# Patient Record
Sex: Female | Born: 1950 | Race: White | Hispanic: No | Marital: Married | Smoking: Never smoker
Health system: Southern US, Community
[De-identification: ages and names within clinical notes are randomized; demographics above are authoritative.]

## PROBLEM LIST (undated history)

## (undated) DIAGNOSIS — K219 Gastro-esophageal reflux disease without esophagitis: Secondary | ICD-10-CM

## (undated) DIAGNOSIS — T7840XA Allergy, unspecified, initial encounter: Secondary | ICD-10-CM

## (undated) DIAGNOSIS — K589 Irritable bowel syndrome without diarrhea: Secondary | ICD-10-CM

## (undated) DIAGNOSIS — M810 Age-related osteoporosis without current pathological fracture: Secondary | ICD-10-CM

## (undated) HISTORY — DX: Allergy, unspecified, initial encounter: T78.40XA

## (undated) HISTORY — DX: Irritable bowel syndrome, unspecified: K58.9

## (undated) HISTORY — DX: Age-related osteoporosis without current pathological fracture: M81.0

## (undated) HISTORY — DX: Gastro-esophageal reflux disease without esophagitis: K21.9

## (undated) HISTORY — PX: FLEXOR TENDON OF FOREARM / WRIST REPAIR: SHX1650

---

## 1986-07-25 HISTORY — PX: TUBAL LIGATION: SHX77

## 1996-07-25 HISTORY — PX: OTHER SURGICAL HISTORY: SHX169

## 1999-03-16 ENCOUNTER — Other Ambulatory Visit: Admission: RE | Admit: 1999-03-16 | Discharge: 1999-03-16 | Payer: Self-pay | Admitting: Obstetrics and Gynecology

## 1999-04-13 ENCOUNTER — Ambulatory Visit (HOSPITAL_BASED_OUTPATIENT_CLINIC_OR_DEPARTMENT_OTHER): Admission: RE | Admit: 1999-04-13 | Discharge: 1999-04-13 | Payer: Self-pay | Admitting: General Practice

## 2000-05-15 ENCOUNTER — Other Ambulatory Visit: Admission: RE | Admit: 2000-05-15 | Discharge: 2000-05-15 | Payer: Self-pay | Admitting: Obstetrics and Gynecology

## 2001-05-21 ENCOUNTER — Other Ambulatory Visit: Admission: RE | Admit: 2001-05-21 | Discharge: 2001-05-21 | Payer: Self-pay | Admitting: Obstetrics and Gynecology

## 2002-07-23 ENCOUNTER — Ambulatory Visit (HOSPITAL_COMMUNITY): Admission: RE | Admit: 2002-07-23 | Discharge: 2002-07-23 | Payer: Self-pay | Admitting: Orthopedic Surgery

## 2002-07-23 ENCOUNTER — Encounter: Payer: Self-pay | Admitting: Orthopedic Surgery

## 2004-09-08 ENCOUNTER — Ambulatory Visit: Payer: Self-pay | Admitting: Internal Medicine

## 2004-10-15 ENCOUNTER — Ambulatory Visit: Payer: Self-pay | Admitting: Internal Medicine

## 2005-02-07 ENCOUNTER — Encounter: Admission: RE | Admit: 2005-02-07 | Discharge: 2005-02-07 | Payer: Self-pay | Admitting: Neurology

## 2005-07-25 HISTORY — PX: FACIAL COSMETIC SURGERY: SHX629

## 2006-02-22 ENCOUNTER — Emergency Department (HOSPITAL_COMMUNITY): Admission: EM | Admit: 2006-02-22 | Discharge: 2006-02-23 | Payer: Self-pay | Admitting: Emergency Medicine

## 2006-03-14 ENCOUNTER — Ambulatory Visit (HOSPITAL_COMMUNITY): Admission: RE | Admit: 2006-03-14 | Discharge: 2006-03-15 | Payer: Self-pay | Admitting: Surgery

## 2006-03-14 ENCOUNTER — Encounter (INDEPENDENT_AMBULATORY_CARE_PROVIDER_SITE_OTHER): Payer: Self-pay | Admitting: Specialist

## 2006-03-25 HISTORY — PX: CHOLECYSTECTOMY: SHX55

## 2007-04-25 HISTORY — PX: ANTERIOR FUSION CERVICAL SPINE: SUR626

## 2007-05-15 ENCOUNTER — Observation Stay (HOSPITAL_COMMUNITY): Admission: RE | Admit: 2007-05-15 | Discharge: 2007-05-16 | Payer: Self-pay | Admitting: Neurosurgery

## 2007-09-26 ENCOUNTER — Encounter: Payer: Self-pay | Admitting: Internal Medicine

## 2008-10-02 ENCOUNTER — Ambulatory Visit: Payer: Self-pay | Admitting: Genetic Counselor

## 2010-12-07 NOTE — Op Note (Signed)
NAMECORTINA, VULTAGGIO              ACCOUNT NO.:  [id number]   MEDICAL RECORD NO.:  [id number]          PATIENT TYPE:  INP   LOCATION:  2899                         FACILITY:  [facility]   PHYSICIAN:  Danae Orleans. Venetia Maxon, M.D.  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   PROCEDURE:  Anterior cervical decompression and fusion C5-6 and C6-7  with allograft bone spacers along with morselized bone autograft and  anterior cervical plate.   SURGEON:  Danae Orleans. Venetia Maxon, M.D.   ASSISTANT:  Georgiann Cocker, RN and Lovell Sheehan   ANESTHESIA:  General endotracheal anesthesia.   ESTIMATED BLOOD LOSS:  Minimal.   COMPLICATIONS:  None.   DISPOSITION:  Recovery.   INDICATIONS:  Kiara Mcdowell is a 60 year old woman with left arm pain  with C5-6 and C6-7 spondylitic disk herniations.  It was elected take  her to surgery for anterior cervical decompression and fusion after she  did not improve with conservative measures.   PROCEDURE:  Ms. Hoefling is brought to the operating room.  Following  satisfactory and uncomplicated induction of general endotracheal  anesthesia and placement of intravenous lines, the patient was placed in  supine position on the operating table.  Her neck was placed in slight  extension and she was placed in 5 pounds halter traction on a horseshoe  head holder.  Her anterior neck was then prepped and draped in the usual  sterile fashion.  Area of planned incision was infiltrated with 0.25%  Marcaine and 0.5% lidocaine with 1:200,000 epinephrine.  Incision was  made from midline to the anterior border of the sternocleidomastoid  muscle  on the left side of midline.  The subplatysmal dissection was  performed exposing the anterior border sternocleidomastoid muscle.  Using blunt dissection the carotid sheath was kept lateral, trachea and  esophagus kept medial, exposing the anterior cervical spine.  A bent  spinal needle was placed at what was felt to be the C5-6 level.  This  was confirmed on intraoperative x-ray.  Subsequently longus colli  muscles were taken down from the anterior cervical spine from C5 to C7  using electrocautery and Key elevator and self-retaining retractor was  placed to facilitate exposure.  Ventral osteophytes were removed with  Leksell rongeur and bone was saved for later use with bone grafting.  The interspaces at C5-6 and C6-7 were then incised with a 15 blade and  disk material was removed in piecemeal fashion.  Endplates were stripped  of residual disk material.  Distraction pins were placed initially at C6  and C7 and a thorough diskectomy was performed.  The endplates were  decorticated and uncinate spurs drilled down.  The posterior  longitudinal ligament was incised and removed in piecemeal fashion  resulting in decompression of spinal cord dura and both C7 nerve roots.  After trial sizing, a 7 mm allograft wedge was then fashioned with high-  speed drill, packed with morcellized bone autograft and some  demineralized bone matrix inserted the interspace and countersunk  appropriately.  Attention was then turned to the C5-6 level where  similar decompression was performed.  Distraction pins were placed.  Endplates were decorticated with a high-speed drill.  The posterior  longitudinal ligament and uncinate spurs were removed with 2 and 3 mm  gold tip Kerrison rongeurs and both C6 nerve roots were well  decompressed at they extended out the neural foramina.  Hemostasis was  again assured with Gelfoam soaked in thrombin and after trial sizing an  8-mm bone allograft wedge was selected thinned  to approximately 6 mm in  height, packed with morselized bone autograft and the DBM and inserted  in the interspace, countersunk appropriately.  A 32 mm anterior cervical  plate was then affixed to the anterior cervical spine using variable  angle 14 mm screws.  Traction was removed prior to placing the plate.  All screws had excellent purchase and locking mechanisms were engaged.  Final x-ray demonstrated well-positioned interbody graft and anterior  cervical plate.  Soft tissues were inspected and found to be in good  repair.  The platysma layer was closed with 3-0 Vicryl sutures.  The  skin edges were approximated 3-0 Vicryl interrupted inverted sutures.  Wound was dressed with Dermabond.  The patient extubated in the  operating room, taken recovery in stable satisfactory condition having  tolerated operation well.  All counts correct at end the case.      Danae Orleans. Venetia Maxon, M.D.  Electronically Signed     JDS/MEDQ  D:  05/15/2007  T:  05/16/2007  Job:  [id number]

## 2011-01-10 ENCOUNTER — Telehealth: Payer: Self-pay | Admitting: Internal Medicine

## 2011-01-10 NOTE — Telephone Encounter (Signed)
Patient state she has had diarrhea and cramping since last Thursday. Has diarrhea 4 times/day. Anytime she eats or drinks she has diarrhea. Denies bleeding, vomiting or fever. Has not taken anything for diarrhea. She will try Imodium. States in the past, Dr. Juanda Chance gave her something for spasms and would like it again. Last EGD, colon 2006- IBS. Please, advise.

## 2011-01-10 NOTE — Telephone Encounter (Signed)
Please call in Bentyl 10 mg, #30, 1 po tid, ac, also try Cipro 250 mg po bid,x 5 days, If no better, will need to see an extender , I will be out of town next week.

## 2011-01-12 MED ORDER — CIPROFLOXACIN HCL 250 MG PO TABS: ORAL_TABLET | ORAL | Status: DC

## 2011-01-12 MED ORDER — DICYCLOMINE HCL 10 MG PO CAPS: ORAL_CAPSULE | ORAL | Status: DC

## 2011-01-12 NOTE — Telephone Encounter (Signed)
Spoke with patient and gave her Dr. Regino Schultze recommendations and told her the rx have been sent.

## 2011-01-12 NOTE — Telephone Encounter (Signed)
Rx sent to pharmacy. Left a message for patient to call me back.

## 2011-03-01 ENCOUNTER — Observation Stay (HOSPITAL_COMMUNITY)
Admission: EM | Admit: 2011-03-01 | Discharge: 2011-03-02 | DRG: 143 | Disposition: A | Discharge status: Home / Self Care | Payer: BC Managed Care – PPO | Source: Ambulatory Visit | Attending: Cardiology | Admitting: Cardiology

## 2011-03-01 ENCOUNTER — Emergency Department (HOSPITAL_COMMUNITY): Payer: BC Managed Care – PPO

## 2011-03-01 DIAGNOSIS — R209 Unspecified disturbances of skin sensation: Secondary | ICD-10-CM | POA: Insufficient documentation

## 2011-03-01 DIAGNOSIS — R0602 Shortness of breath: Secondary | ICD-10-CM | POA: Insufficient documentation

## 2011-03-01 DIAGNOSIS — R079 Chest pain, unspecified: Principal | ICD-10-CM | POA: Insufficient documentation

## 2011-03-01 LAB — COMPREHENSIVE METABOLIC PANEL
ALT: 15 U/L (ref 0–35)
Alkaline Phosphatase: 66 U/L (ref 39–117)
CO2: 25 mEq/L (ref 19–32)
Calcium: 9.7 mg/dL (ref 8.4–10.5)
GFR calc Af Amer: >60 mL/min (ref >60)
GFR calc non Af Amer: >60 mL/min (ref >60)
Glucose, Bld: 91 mg/dL (ref 70–99)
Potassium: 3.7 mEq/L (ref 3.5–5.1)
Sodium: 137 mEq/L (ref 135–145)

## 2011-03-01 LAB — CBC
HCT: 38.3 % (ref 36.0–46.0)
MCHC: 35.8 g/dL (ref 30.0–36.0)
Platelets: 245 10*3/uL (ref 150–400)
RDW: 12.7 % (ref 11.5–15.5)

## 2011-03-01 LAB — DIFFERENTIAL
Basophils Absolute: 0.1 10*3/uL (ref 0.0–0.1)
Eosinophils Absolute: 0.2 10*3/uL (ref 0.0–0.7)
Eosinophils Relative: 2 % (ref 0–5)
Lymphocytes Relative: 38 % (ref 12–46)
Monocytes Absolute: 0.5 10*3/uL (ref 0.1–1.0)

## 2011-03-01 LAB — CK TOTAL AND CKMB (NOT AT ARMC): Total CK: 161 U/L (ref 7–177)

## 2011-03-02 ENCOUNTER — Other Ambulatory Visit (HOSPITAL_COMMUNITY): Payer: BC Managed Care – PPO

## 2011-03-02 ENCOUNTER — Inpatient Hospital Stay (HOSPITAL_COMMUNITY): Payer: BC Managed Care – PPO

## 2011-03-02 DIAGNOSIS — R079 Chest pain, unspecified: Secondary | ICD-10-CM

## 2011-03-02 LAB — LIPID PANEL
Cholesterol: 233 mg/dL — ABNORMAL HIGH (ref 0–200)
HDL: 88 mg/dL (ref >39)
LDL Cholesterol: 138 mg/dL — ABNORMAL HIGH (ref 0–99)
Total CHOL/HDL Ratio: 2.6 ratio
Triglycerides: 34 mg/dL (ref <150)
VLDL: 7 mg/dL (ref 0–40)

## 2011-03-02 LAB — CARDIAC PANEL(CRET KIN+CKTOT+MB+TROPI)
Relative Index: 1.9 (ref 0.0–2.5)
Relative Index: 2 (ref 0.0–2.5)
Troponin I: <0.3 ng/mL (ref <0.30)

## 2011-03-02 MED ORDER — TECHNETIUM TC 99M TETROFOSMIN IV KIT
30.0000 | PACK | Freq: Once | INTRAVENOUS | Status: AC | PRN
  Administered 2011-03-02: 30 via INTRAVENOUS

## 2011-03-02 MED ORDER — IOHEXOL 350 MG/ML SOLN
100.0000 mL | Freq: Once | INTRAVENOUS | Status: AC | PRN
  Administered 2011-03-02: 100 mL via INTRAVENOUS

## 2011-03-02 MED ORDER — TECHNETIUM TC 99M TETROFOSMIN IV KIT
10.0000 | PACK | Freq: Once | INTRAVENOUS | Status: AC | PRN
  Administered 2011-03-02: 10 via INTRAVENOUS

## 2011-03-03 NOTE — H&P (Signed)
  NAMEROSINE, SOLECKI NO.:  [id number]  MEDICAL RECORD NO.:  [id number]  LOCATION:  2035                         FACILITY:  [facility]  PHYSICIAN:  Armanda Magic, M.D.     DATE OF BIRTH:  1951/03/02  DATE OF ADMISSION:  03/01/2011 DATE OF DISCHARGE:                             HISTORY & PHYSICAL   REFERRING PHYSICIAN:  Hassan Buckler. Caporossi, MD  CHIEF COMPLAINT:  Chest tightness.  HISTORY OF PRESENT ILLNESS:  This is a 60 year old white female with no prior cardiac history who was in her usual state of health until 1 a.m. this morning when she awakened with severe left-sided chest pain and left arm numbness.  Her pain was initially increased with deep breathing and sharp and she was short of breath.  She says she got up and drank some water and walked around.  Initially, the pain was sharp and resolved within 20 seconds but then she would had chest pressure which she has had intermittently since then.  Currently, she complains of some vague chest pressure with intermittent left arm numbness.  She denies any nausea, vomiting, or diaphoresis.  PAST MEDICAL HISTORY:  None.  PAST SURGICAL HISTORY:  C-section x3, cholecystectomy, cervical spine fusion, facial surgery, tubal ligation, and pelvic bone cyst removal.  ALLERGIES:  None.  MEDICATIONS:  None.  SOCIAL HISTORY:  She is married with three children.  She denies any tobacco use.  She occasionally drinks alcohol socially.  FAMILY HISTORY:  Father died at 4 of hypertension and Alzheimer's.  Her mother is alive with permanent pacemaker 66.  She has two sisters and none with coronary artery disease.  REVIEW OF SYSTEMS:  Otherwise, stated in the HPI is negative.  PHYSICAL EXAMINATION:  GENERAL:  A well-developed, well-nourished female in no acute distress. HEENT:  Benign. NECK:  Supple without lymphadenopathy.  Carotid upstrokes are +2 bilaterally, no bruits. LUNGS:  Clear to auscultation  throughout. HEART:  Regular rate and rhythm.  No murmurs, rubs or gallops.  Normal S1, S2. ABDOMEN:  Soft, nontender, nondistended.  Normoactive bowel sounds.  No hepatosplenomegaly. EXTREMITIES:  No edema.  LABORATORY DATA:  Pending.  EKG shows normal sinus rhythm with no ST changes.  Chest x-ray is pending.  ASSESSMENT:  Chest pain with typical and atypical components.  EKG is nonischemic.  Cardiac enzymes are pending.  She has no cardiac risk factors except for age.  This could represent unstable angina versus gastroesophageal reflux with esophageal spasm.  PLAN:  Admit to telemetry bed.  We will cycle cardiac enzymes, IV heparin drip per Pharmacy.  Aspirin.  We will give a GI cocktail to see if pain is improved.  We will start IV nitroglycerin drip and titrate to keep pain-free.  We will check a D-dimer to rule out PE.  We will make n.p.o. after midnight if cardiac enzymes and D-dimer are normal.  We will plan stress Cardiolite study in the morning.     Armanda Magic, M.D.     TT/MEDQ  D:  03/01/2011  T:  03/02/2011  Job:  [id number]  cc:   [facility]  Electronically Signed by Armanda Magic M.D. on 03/03/2011 07:39:05 PM

## 2011-03-04 ENCOUNTER — Telehealth: Payer: Self-pay | Admitting: Internal Medicine

## 2011-03-04 NOTE — Telephone Encounter (Signed)
Scheduled patient on 03/14/11 at 2:00 PM. Called and left a message for patient to call me.

## 2011-03-04 NOTE — Telephone Encounter (Signed)
Patient given appointment time.

## 2011-03-10 ENCOUNTER — Encounter: Payer: Self-pay | Admitting: Internal Medicine

## 2011-03-14 ENCOUNTER — Other Ambulatory Visit (INDEPENDENT_AMBULATORY_CARE_PROVIDER_SITE_OTHER): Payer: BC Managed Care – PPO

## 2011-03-14 ENCOUNTER — Ambulatory Visit (INDEPENDENT_AMBULATORY_CARE_PROVIDER_SITE_OTHER): Payer: BC Managed Care – PPO | Admitting: Internal Medicine

## 2011-03-14 ENCOUNTER — Encounter: Payer: Self-pay | Admitting: Internal Medicine

## 2011-03-14 DIAGNOSIS — R079 Chest pain, unspecified: Secondary | ICD-10-CM

## 2011-03-14 DIAGNOSIS — R918 Other nonspecific abnormal finding of lung field: Secondary | ICD-10-CM

## 2011-03-14 DIAGNOSIS — R9389 Abnormal findings on diagnostic imaging of other specified body structures: Secondary | ICD-10-CM

## 2011-03-14 LAB — AMYLASE: Amylase: 90 U/L (ref 27–131)

## 2011-03-14 LAB — LIPASE: Lipase: 42 U/L (ref 11.0–59.0)

## 2011-03-14 LAB — HEPATIC FUNCTION PANEL
ALT: 16 U/L (ref 0–35)
Albumin: 4.3 g/dL (ref 3.5–5.2)
Total Bilirubin: 0.6 mg/dL (ref 0.3–1.2)

## 2011-03-14 NOTE — Patient Instructions (Addendum)
You have been scheduled for an abdominal ultrasound at [facility] (1st floor of hospital) on 03/15/11 at 3:00 pm. Please arrive 15 minutes prior to your appointment for registration. Make certain not to have anything to eat or drink 6 hours prior to your appointment. Should you need to reschedule your appointment, please contact radiology at [phone]. You have been scheduled to see Dr Shelle Iron @ [facility] (2nd floor, [address]) on 03/30/11 @ 3:30 pm with a 3:15 pm arrival. Please bring insurance cards, all medications (including supplements) and any copay you may have. Your physician has requested that you go to the basement for the following lab work before leaving today: Hepatic Fx, Amylase, Lipase

## 2011-03-14 NOTE — Progress Notes (Signed)
[name] 05-May-1951 MRN [id number]     History of Present Illness:  This is a 60 year old white female with an episode of severe substernal chest pain and precordial pain evaluated in the emergency room 2 weeks ago. Cardiac causes have been ruled out. She saw Dr Golden Circle. She has had belching and dyspepsia but no dysphagia. She has a history of gastroesophageal reflux evaluated with an upper endoscopy in 2006. She subsequently underwent a laparoscopic cholecystectomy in 2008 for choledocholithiasis. She has been on Protonix 40 mg a day for 2 weeks and the pain has resolved but she has an ongoing heaviness substernally and in the left precordium. The pain is not pleuritic or associated with exertion. Initially, the pain was worse when lying down. She denies history of asthma. She has occassional cough. She has never smoked. A chest x-ray in 2001 showed hyperinflation. Her most recent chest x-ray shows some mild cardiomegaly with mild COPD. Patient denies shortness of breath.   Past Medical History  Diagnosis Date  . IBS (irritable bowel syndrome)   . Duodenitis 2006   Past Surgical History  Procedure Date  . Cesarean section     x 3  . Cholecystectomy   . Anterior fusion cervical spine   . Facial cosmetic surgery   . Tubal ligation   . Pelvic bone cyst resection     reports that she has never smoked. She does not have any smokeless tobacco history on file. She reports that she drinks alcohol. She reports that she does not use illicit drugs. family history includes Breast cancer in her maternal aunt and maternal grandmother; Crohn's disease in her sister; Heart disease in her mother; Hypertension in her father; Irritable bowel syndrome in her sister; and Stroke in her maternal grandmother.  There is no history of Colon cancer. No Known Allergies      Review of Systems: Negative for dysphagia, odynophagia. Negative for wheezing. Irregular bowel habits since cholecystectomy  The  remainder of the 10  point ROS is negative except as outlined in H&P   Physical Exam: General appearance  Well developed, in no distress. Eyes- non icteric. HEENT nontraumatic, normocephalic. Mouth no lesions, tongue papillated, no cheilosis. Neck supple without adenopathy, thyroid not enlarged, no carotid bruits, no JVD. Lungs Clear to auscultation bilaterally. Cor normal S1 normal S2, regular rhythm , no murmur,  quiet precordium. Abdomen soft, nontender with normoactive bowel sounds. No distention. No tenderness. Liver edge at costal margin. Rectal: Not done. Extremities no pedal edema. Skin no lesions. Neurological alert and oriented x 3. Psychological normal mood and affect.  Assessment and Plan:  Problem #1 Episodes of noncardiac chest pain in the setting of gastroesophageal reflux. It is only partially improved on PPI's. There was Early emphysema on chest x-ray. Needs further evaluation. Rule out  common bile ducts .Marland Kitchen We will obtain liver function test amylase and lipase an upper abdominal ultrasound. She is going out of the country in 2 days and we will wait until she comes back to proceed with upper endoscopy. We will also refer her for pulmonary consultation. She is to continue her Protonix 40 mg daily.  Problem #2 Colorectal screening. Her last colonoscopy was in March 2006. Her next colonoscopy will be due in March 2016.    03/14/2011 Lina Sar

## 2011-03-15 ENCOUNTER — Ambulatory Visit (HOSPITAL_COMMUNITY)
Admission: RE | Admit: 2011-03-15 | Discharge: 2011-03-15 | Disposition: A | Discharge status: Home / Self Care | Payer: BC Managed Care – PPO | Source: Ambulatory Visit | Attending: Internal Medicine | Admitting: Internal Medicine

## 2011-03-15 DIAGNOSIS — R1012 Left upper quadrant pain: Secondary | ICD-10-CM | POA: Insufficient documentation

## 2011-03-15 DIAGNOSIS — N289 Disorder of kidney and ureter, unspecified: Secondary | ICD-10-CM | POA: Insufficient documentation

## 2011-03-29 ENCOUNTER — Other Ambulatory Visit: Payer: Self-pay | Admitting: Obstetrics and Gynecology

## 2011-03-30 ENCOUNTER — Institutional Professional Consult (permissible substitution): Payer: BC Managed Care – PPO | Admitting: Pulmonary Disease

## 2011-04-01 ENCOUNTER — Ambulatory Visit (INDEPENDENT_AMBULATORY_CARE_PROVIDER_SITE_OTHER): Payer: BC Managed Care – PPO | Admitting: Pulmonary Disease

## 2011-04-01 ENCOUNTER — Encounter: Payer: Self-pay | Admitting: Pulmonary Disease

## 2011-04-01 VITALS — BP 100/66 | HR 68 | Temp 98.1°F | Ht 64.0 in | Wt 132.0 lb

## 2011-04-01 DIAGNOSIS — R918 Other nonspecific abnormal finding of lung field: Secondary | ICD-10-CM

## 2011-04-01 NOTE — Progress Notes (Signed)
  Subjective:    Patient ID: [name], female    DOB: 1951/01/23, 60 y.o.   MRN: [id number]  HPI The patient is a 60 year old female who I've been asked to see for an abnormal CT chest.  She recently had an episode of chest pain, and presented to the emergency room for evaluation.  She had a CT chest that was totally unremarkable except for 2 very small pulmonary nodules associated with the minor fissure.  The patient has never smoked, has no personal history of cancer, and tells me that her health maintenance is totally up to date.  She has never lived outside the state of [place], has no direct tuberculosis exposure history, and has had a negative PPD in the past.  She has no pulmonary symptoms of significance at this time.   Review of Systems  Constitutional: Negative for fever and unexpected weight change.  HENT: Negative for ear pain, nosebleeds, congestion, sore throat, rhinorrhea, sneezing, trouble swallowing, dental problem, postnasal drip and sinus pressure.   Eyes: Negative for redness and itching.  Respiratory: Positive for cough and shortness of breath. Negative for chest tightness and wheezing.   Cardiovascular: Positive for chest pain. Negative for palpitations and leg swelling.  Gastrointestinal: Negative for nausea and vomiting.  Genitourinary: Negative for dysuria.  Musculoskeletal: Positive for joint swelling.  Skin: Negative for rash.  Neurological: Negative for headaches.  Hematological: Does not bruise/bleed easily.  Psychiatric/Behavioral: Negative for dysphoric mood. The patient is not nervous/anxious.        Objective:   Physical Exam Constitutional:  Well developed, no acute distress  HENT:  Nares patent without discharge  Oropharynx without exudate, palate and uvula are normal  Eyes:  Perrla, eomi, no scleral icterus  Neck:  No JVD, no TMG  Cardiovascular:  Normal rate, regular rhythm, no rubs or gallops.  No murmurs        Intact distal  pulses  Pulmonary :  Normal breath sounds, no stridor or respiratory distress   No rales, rhonchi, or wheezing  Abdominal:  Soft, nondistended, bowel sounds present.  No tenderness noted.   Musculoskeletal:  No lower extremity edema noted.  Lymph Nodes:  No cervical lymphadenopathy noted  Skin:  No cyanosis noted  Neurologic:  Alert, appropriate, moves all 4 extremities without obvious deficit.         Assessment & Plan:

## 2011-04-01 NOTE — Patient Instructions (Signed)
Would recommend a followup ct chest in one year to followup your tiny nodules.  Please call us in July 2013 to schedule for August.

## 2011-04-01 NOTE — Assessment & Plan Note (Signed)
The patient has been noted to have 2 tiny nodules associated with the minor fissure on a recent CT chest.  She is in a very low risk group with no history of smoking or personal history of cancer.  Most likely these represent either an intrapulmonary lymph node, or possibly granulomas.  It is very unlikely to represent a malignant process.  At this point, I would recommend one CT followup at one year, and if no change would not pursue further.  The patient is to call me if something changes in her medical history over that time period.

## 2011-04-09 NOTE — Discharge Summary (Signed)
  [name], [name] NO.:  [id number]  MEDICAL RECORD NO.:  [id number]  LOCATION:  2035                         FACILITY:  [facility]  PHYSICIAN:  Marca Ancona, MD      DATE OF BIRTH:  1951-05-29  DATE OF ADMISSION:  03/01/2011 DATE OF DISCHARGE:  03/02/2011                              DISCHARGE SUMMARY   PRIMARY CARDIOLOGIST:  Marca Ancona, MD  DISCHARGE DIAGNOSIS:  Chest pain without objective evidence of ischemia.  SECONDARY DIAGNOSES: 1. Status post cesarean section x3. 2. Status post cholecystectomy. 3. Status post cervical spine fusion. 4. Status post facial surgery. 5. Status post tubal ligation. 6. Status post pelvic bone cyst resection.  ALLERGIES:  No known drug allergies.  PROCEDURES:  Exercise Myoview showing no significant ECG changes. Ejection fraction was greater than 60%.  No evidence for ischemia or infarct.  HISTORY OF PRESENT ILLNESS:  This is a 60 year old female without prior cardiac history who was in her usual state of health until approximately 1:00 a.m. in the morning of March 01, 2011, when she awakened with severe sharp left-sided chest discomfort and left arm numbness that was initially worse with deep breathing lasting about 20 seconds resolving spontaneously.  Because of intermittent sharp and shooting chest pain, she presented to the [facility] ED where ECG was nonacute and point-of- care markers were negative.  She was admitted for further evaluation.  HOSPITAL COURSE:  The patient ruled out for MI and had no further chest pain.  CT angiography was performed of the chest and this was negative for pulmonary embolus.  Decision was made to pursue inpatient diagnostic evaluation with an exercise Myoview and this was performed today, March 02, 2011, showing no evidence of ischemia or infarct.  The patient will be discharged home this afternoon in good condition.  DISCHARGE LABORATORY DATA:  Hemoglobin 13.7, hematocrit 38.3,  WBC 6.8, platelets 245.  INR 0.0, D-dimer 0.49.  Sodium 137, hematocrit 3.7, chloride 102, CO2 of 25, BUN 17, creatinine 0.69, glucose 91, total bilirubin 0.4, alkaline phosphatase 66, AST 20, ALT 15, total protein 7.3, albumin 4.2, calcium 9.7.  Hemoglobin A1c 5.6, CK 113, MB 2.3, troponin I less than 0.30.  Total cholesterol 233, triglycerides 34, HDL 88, LDL 138.  DISPOSITION:  The patient will be discharged home today in good condition.  FOLLOWUP PLANS AND APPOINTMENTS:  The patient is requested to follow up with primary care provider in the next 1-2 weeks.  DISCHARGE MEDICATIONS:  Protonix 40 mg daily.  OUTSTANDING LAB STUDIES:  None.  DURATION OF DISCHARGE ENCOUNTER:  Thirty-five minutes including physician time.  Thank you for allowing me to evaluate this patient.     Nicolasa Ducking, ANP   ______________________________ Marca Ancona, MD    CB/MEDQ  D:  03/02/2011  T:  03/03/2011  Job:  [id number]  Electronically Signed by Nicolasa Ducking ANP on 03/04/2011 03:36:59 PM Electronically Signed by Marca Ancona MD on 04/09/2011 10:39:29 PM

## 2011-04-11 ENCOUNTER — Ambulatory Visit: Payer: Self-pay | Admitting: Internal Medicine

## 2011-05-04 LAB — CBC
Hemoglobin: 13.8
RDW: 12.7

## 2011-05-11 ENCOUNTER — Ambulatory Visit (INDEPENDENT_AMBULATORY_CARE_PROVIDER_SITE_OTHER): Payer: BC Managed Care – PPO | Admitting: Internal Medicine

## 2011-05-11 ENCOUNTER — Encounter: Payer: Self-pay | Admitting: Internal Medicine

## 2011-05-11 DIAGNOSIS — K219 Gastro-esophageal reflux disease without esophagitis: Secondary | ICD-10-CM

## 2011-05-11 DIAGNOSIS — Z0000 Encounter for general adult medical examination without abnormal findings: Secondary | ICD-10-CM

## 2011-05-11 DIAGNOSIS — R918 Other nonspecific abnormal finding of lung field: Secondary | ICD-10-CM

## 2011-05-11 DIAGNOSIS — K589 Irritable bowel syndrome without diarrhea: Secondary | ICD-10-CM

## 2011-05-11 NOTE — Progress Notes (Signed)
Subjective:    Patient ID: [name], female    DOB: [birth year], 60 y.o.   MRN: [id number]  HPI [name] presents to establish for on-going continuity care. She had a recent evaluation for chest pain in August: negative cardiac enzymes, normal EKG, and came to a nuclear stress test August 27th - normal with no evidence of obstructed blood flow or any old injury. Her lab evaluation included normal lipid profile with an HDL 88 ( nl 39+) and an LDL 138 (nl 130 or less) and the LDL/HDL ratio at less than 2 is very protective. In addition, she was ruled out for any glucose metabolism issues with a A1C 5.6% (4.5-6.5%).   She was seen by Dr. Juanda Chance for GI with evidence of possible reflux as cause of her symptom with a good response to PPI therapy. 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  During her evaluation chest x-ray revealed hyper-inflation. CT angio - negative for clot and revealed incidental small nodules for which she saw Dr. Shelle Iron who was not concerned and recommended follow-up CT in 1 year.   She is today feeling well and doing well. She is current for health maintenance with Gyn.   Past Medical History  Diagnosis Date  . IBS (irritable bowel syndrome)   . Duodenitis 2006  . GERD (gastroesophageal reflux disease)    Past Surgical History  Procedure Date  . Cesarean section     x 3  . Cholecystectomy 03/2006  . Anterior fusion cervical spine 04/2007  . Facial cosmetic surgery   . Tubal ligation 1988  . Pelvic bone cyst resection   . Flexor tendon of forearm / wrist repair '05    Dr. Teressa Senter for tennis injury-left forearm   Family History  Problem Relation Age of Onset  . Stroke Maternal Grandmother   . Breast cancer Maternal Grandmother   . Crohn's disease Sister   . Breast cancer Maternal Aunt   . Irritable bowel syndrome Sister   . Heart disease Sister     had rheumatic fever  .  Hypertension Sister   . Colon cancer Neg Hx   . Diabetes Neg Hx   . Heart disease Mother     SSS with PTVDP  . Arthritis Mother   . Hypertension Father   . Alzheimer's disease Father    History   Social History  . Marital Status: Married    Spouse Name: Keith Rake    Number of Children: 3  . Years of Education: 16   Occupational History  . homemaker    Social History Main Topics  . Smoking status: Never Smoker   . Smokeless tobacco: Never Used  . Alcohol Use: 1.0 oz/week    2 drink(s) per week     socially 3 x a week  . Drug Use: No  . Sexually Active: Yes -- Female partner(s)   Other Topics Concern  . Not on file   Social History Narrative   HSG, [employer]. Married '78  1 dtr - '79, 2 sons ' 82, '87. Work - worked in the [employer], now [employer]. Marriage in good health. No history of abuse. ACP-has a living will: yes - CPR, yes - short-term mechanical ventilation, no futile or heroic measures in the face of a poor prognosis.       Review of Systems Constitutional:  Negative for fever, chills, activity change and unexpected weight change.  HEENT:  Negative for hearing loss, ear pain, congestion, neck stiffness and postnasal drip. Negative for sore throat or swallowing problems. Negative for dental complaints.   Eyes: Negative for vision loss or change in visual acuity.  Respiratory: Negative for chest tightness and wheezing. Negative for DOE.   Cardiovascular: Negative for chest pain or palpitations. No decreased exercise tolerance Gastrointestinal: No change in bowel habit. No bloating or gas. No reflux or indigestion Genitourinary: Negative for urgency, frequency, flank pain and difficulty urinating.  Musculoskeletal: Negative for myalgias, back pain, arthralgias and gait problem.  Neurological: Negative for dizziness, tremors, weakness and headaches.  Hematological: Negative for adenopathy.  Psychiatric/Behavioral: Negative for behavioral  problems and dysphoric mood.       Objective:   Physical Exam Vitals noted - excellent BP Gen'l - well nourished well groomed woman in no distress HEENT - C&S clear, Pupils equal and reactive. Oropharynx with good dentition and no buccal or palatal lesions. Posterior pharynx clear Neck - supple, no thyromegaly Pulm- normal respirations, normal breath sounds Cor - 2+ radial pulse, RRR w/o murmurs. Extremities - no deformity noted Neuro - A&O x 3, CN II-XII grossly normal, normal gait        Assessment & Plan:

## 2011-05-14 DIAGNOSIS — K589 Irritable bowel syndrome without diarrhea: Secondary | ICD-10-CM | POA: Insufficient documentation

## 2011-05-14 DIAGNOSIS — Z0000 Encounter for general adult medical examination without abnormal findings: Secondary | ICD-10-CM | POA: Insufficient documentation

## 2011-05-14 DIAGNOSIS — K219 Gastro-esophageal reflux disease without esophagitis: Secondary | ICD-10-CM | POA: Insufficient documentation

## 2011-05-14 NOTE — Assessment & Plan Note (Signed)
History is unremarkable - thorough evaluation for heart disease was negative. Evaluation of incidental chest nodules - benign. Limited physical exam today is normal. She is current with her gynecologist for pelvic/PAP and breast exam. She is current with mammography. She is current for colorectal cancer screening with last study in '06. Immunizations need to be brought up to-date. Liver functions recently checked and normal. She has had routine labs at [facility] and will obtain records. Recommendations for additional labs may be made after review.  [name] is oriented to the services and logistical aspects of the practice.  In summary - a delightful woman who appears to be quite healthy. She does invest in her health. She will be asked to return for additional labs as needed. Otherwise she will return as needed or in 1 year.

## 2011-05-14 NOTE — Assessment & Plan Note (Signed)
Almost certainly benign. 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  Plan - follow-up CT in 1 year.

## 2011-05-14 NOTE — Assessment & Plan Note (Signed)
Stable on PPI therapy with pantoprazole.  Plan - continue present medications           Advised to watch for uncontrolled pain, any swallowing difficulty in which case she will call.

## 2011-07-07 ENCOUNTER — Ambulatory Visit (INDEPENDENT_AMBULATORY_CARE_PROVIDER_SITE_OTHER): Payer: BC Managed Care – PPO | Admitting: Endocrinology

## 2011-07-07 ENCOUNTER — Encounter: Payer: Self-pay | Admitting: Endocrinology

## 2011-07-07 VITALS — BP 112/72 | HR 67 | Temp 98.4°F | Ht 64.0 in | Wt 129.8 lb

## 2011-07-07 DIAGNOSIS — J069 Acute upper respiratory infection, unspecified: Secondary | ICD-10-CM

## 2011-07-07 MED ORDER — PROMETHAZINE-CODEINE 6.25-10 MG/5ML PO SYRP: 5.0000 mL | ORAL_SOLUTION | ORAL | Status: DC | PRN

## 2011-07-07 MED ORDER — AZITHROMYCIN 500 MG PO TABS: 500.0000 mg | ORAL_TABLET | Freq: Every day | ORAL | Status: AC

## 2011-07-07 NOTE — Progress Notes (Signed)
  Subjective:    Patient ID: [name], female    DOB: 1950/09/25, 60 y.o.   MRN: [id number]  HPI Pt states few days of moderate pain at the throat, and assoc dry cough.  No nasal congestion.   Past Medical History  Diagnosis Date  . IBS (irritable bowel syndrome)   . Duodenitis 2006  . GERD (gastroesophageal reflux disease)     Past Surgical History  Procedure Date  . Cesarean section     x 3  . Cholecystectomy 03/2006  . Anterior fusion cervical spine 04/2007  . Facial cosmetic surgery   . Tubal ligation 1988  . Pelvic bone cyst resection   . Flexor tendon of forearm / wrist repair '05    Dr. Teressa Senter for tennis injury-left forearm    History   Social History  . Marital Status: Married    Spouse Name: Keith Rake    Number of Children: 3  . Years of Education: 16   Occupational History  . homemaker    Social History Main Topics  . Smoking status: Never Smoker   . Smokeless tobacco: Never Used  . Alcohol Use: 1.0 oz/week    2 drink(s) per week     socially 3 x a week  . Drug Use: No  . Sexually Active: Yes -- Female partner(s)   Other Topics Concern  . Not on file   Social History Narrative   HSG, [employer]. Married '78  1 dtr - '79, 2 sons ' 82, '87. Work - worked in the [employer], now [employer]. Marriage in good health. No history of abuse. ACP-has a living will: yes - CPR, yes - short-term mechanical ventilation, no futile or heroic measures in the face of a poor prognosis.    Current Outpatient Prescriptions on File Prior to Visit  Medication Sig Dispense Refill  . pantoprazole (PROTONIX) 40 MG tablet Take 40 mg by mouth daily.          No Known Allergies  Family History  Problem Relation Age of Onset  . Stroke Maternal Grandmother   . Breast cancer Maternal Grandmother   . Crohn's disease Sister   . Breast cancer Maternal Aunt   . Irritable bowel syndrome Sister   . Heart disease Sister     had rheumatic fever  .  Hypertension Sister   . Colon cancer Neg Hx   . Diabetes Neg Hx   . Heart disease Mother     SSS with PTVDP  . Arthritis Mother   . Hypertension Father   . Alzheimer's disease Father     BP 112/72  Pulse 67  Temp(Src) 98.4 F (36.9 C) (Oral)  Ht 5\' 4"  (1.626 m)  Wt 129 lb 12.8 oz (58.877 kg)  BMI 22.28 kg/m2  SpO2 96%  Review of Systems Denies fever--she has low-grade temp.      Objective:   Physical Exam VITAL SIGNS:  See vs page GENERAL: no distress head: no deformity eyes: no periorbital swelling, no proptosis external nose and ears are normal mouth: no lesion seen Left tm is red--right is normal NECK: There is no palpable thyroid enlargement.  No thyroid nodule is palpable.  No palpable lymphadenopathy at the anterior neck. LUNGS:  Clear to auscultation     Assessment & Plan:  URI, new

## 2011-07-07 NOTE — Patient Instructions (Addendum)
Here are 2 prescriptions:  1 for an antibiotic, and 1 for cough syrup. I hope you feel better soon.  If you don't feel better by next week, please call dr Debby Bud.

## 2011-09-15 ENCOUNTER — Encounter: Payer: Self-pay | Admitting: Internal Medicine

## 2011-09-19 ENCOUNTER — Encounter: Payer: Self-pay | Admitting: Internal Medicine

## 2012-01-29 ENCOUNTER — Other Ambulatory Visit: Payer: Self-pay | Admitting: Cardiovascular Disease

## 2012-01-30 NOTE — Telephone Encounter (Signed)
..   Requested Prescriptions   Pending Prescriptions Disp Refills  . pantoprazole (PROTONIX) 40 MG tablet [Pharmacy Med Name: PANTOPRAZOLE SOD DR 40 MG TAB] 30 tablet 2    Sig: TAKE 1 TABLET BY MOUTH DAILY

## 2012-11-07 ENCOUNTER — Encounter (INDEPENDENT_AMBULATORY_CARE_PROVIDER_SITE_OTHER): Payer: Managed Care, Other (non HMO) | Admitting: Internal Medicine

## 2012-11-07 ENCOUNTER — Ambulatory Visit (INDEPENDENT_AMBULATORY_CARE_PROVIDER_SITE_OTHER): Payer: Managed Care, Other (non HMO) | Admitting: Internal Medicine

## 2012-11-07 ENCOUNTER — Encounter: Payer: Self-pay | Admitting: Internal Medicine

## 2012-11-07 VITALS — BP 100/70 | HR 76 | Temp 98.3°F | Resp 16 | Wt 127.0 lb

## 2012-11-07 DIAGNOSIS — R5381 Other malaise: Secondary | ICD-10-CM

## 2012-11-07 DIAGNOSIS — F439 Reaction to severe stress, unspecified: Secondary | ICD-10-CM

## 2012-11-07 DIAGNOSIS — R300 Dysuria: Secondary | ICD-10-CM

## 2012-11-07 DIAGNOSIS — Z733 Stress, not elsewhere classified: Secondary | ICD-10-CM

## 2012-11-07 DIAGNOSIS — E538 Deficiency of other specified B group vitamins: Secondary | ICD-10-CM

## 2012-11-07 DIAGNOSIS — R109 Unspecified abdominal pain: Secondary | ICD-10-CM

## 2012-11-07 DIAGNOSIS — R5383 Other fatigue: Secondary | ICD-10-CM

## 2012-11-07 DIAGNOSIS — F4323 Adjustment disorder with mixed anxiety and depressed mood: Secondary | ICD-10-CM

## 2012-11-07 LAB — HEPATIC FUNCTION PANEL
ALT: 12 U/L (ref 0–35)
AST: 14 U/L (ref 0–37)
Alkaline Phosphatase: 65 U/L (ref 39–117)
Bilirubin, Direct: 0.1 mg/dL (ref 0.0–0.3)
Total Bilirubin: 0.9 mg/dL (ref 0.3–1.2)

## 2012-11-07 LAB — CBC WITH DIFFERENTIAL/PLATELET
Basophils Relative: 0.5 % (ref 0.0–3.0)
Eosinophils Relative: 0.2 % (ref 0.0–5.0)
MCV: 90.4 fl (ref 78.0–100.0)
Monocytes Absolute: 0.6 10*3/uL (ref 0.1–1.0)
Monocytes Relative: 6.5 % (ref 3.0–12.0)
Neutrophils Relative %: 78.6 % — ABNORMAL HIGH (ref 43.0–77.0)
Platelets: 259 10*3/uL (ref 150.0–400.0)
RBC: 4.59 Mil/uL (ref 3.87–5.11)
WBC: 9.7 10*3/uL (ref 4.5–10.5)

## 2012-11-07 LAB — BRAIN NATRIURETIC PEPTIDE: Pro B Natriuretic peptide (BNP): 25 pg/mL (ref 0.0–100.0)

## 2012-11-07 LAB — SEDIMENTATION RATE: Sed Rate: 17 mm/hr (ref 0–22)

## 2012-11-07 LAB — BASIC METABOLIC PANEL
BUN: 13 mg/dL (ref 6–23)
CO2: 29 mEq/L (ref 19–32)
Calcium: 9.6 mg/dL (ref 8.4–10.5)
Creatinine, Ser: 0.9 mg/dL (ref 0.4–1.2)
GFR: 69.8 mL/min (ref >60.00)
Glucose, Bld: 107 mg/dL — ABNORMAL HIGH (ref 70–99)
Sodium: 137 mEq/L (ref 135–145)

## 2012-11-07 LAB — URINALYSIS, ROUTINE W REFLEX MICROSCOPIC
Bilirubin Urine: NEGATIVE
Ketones, ur: NEGATIVE
Specific Gravity, Urine: 1.025 (ref 1.000–1.030)
Urine Glucose: NEGATIVE
Urobilinogen, UA: 0.2 (ref 0.0–1.0)

## 2012-11-07 MED ORDER — VITAMIN B-12 1000 MCG SL SUBL: 1.0000 | SUBLINGUAL_TABLET | Freq: Every day | SUBLINGUAL | Status: AC

## 2012-11-07 MED ORDER — KETOROLAC TROMETHAMINE 30 MG/ML IJ SOLN
30.0000 mg | Freq: Once | INTRAMUSCULAR | Status: AC
  Administered 2012-11-07: 60 mg via INTRAVENOUS

## 2012-11-07 MED ORDER — CEFTRIAXONE SODIUM 1 G IJ SOLR
1.0000 g | Freq: Once | INTRAMUSCULAR | Status: AC
  Administered 2012-11-07: 1 g via INTRAMUSCULAR

## 2012-11-07 MED ORDER — CIPROFLOXACIN HCL 500 MG PO TABS: 500.0000 mg | ORAL_TABLET | Freq: Two times a day (BID) | ORAL | Status: DC

## 2012-11-07 MED ORDER — CLONAZEPAM 0.25 MG PO TBDP: 0.2500 mg | ORAL_TABLET | Freq: Two times a day (BID) | ORAL | Status: DC | PRN

## 2012-11-07 MED ORDER — MIRTAZAPINE 15 MG PO TABS
15.0000 mg | ORAL_TABLET | Freq: Every day | ORAL | Status: DC
Start: 2012-11-07 — End: 2013-06-26

## 2012-11-07 MED ORDER — KETOROLAC TROMETHAMINE 10 MG PO TABS: 10.0000 mg | ORAL_TABLET | Freq: Four times a day (QID) | ORAL | Status: DC | PRN

## 2012-11-07 MED ORDER — VITAMIN D 1000 UNITS PO TABS: 1000.0000 [IU] | ORAL_TABLET | Freq: Every day | ORAL | Status: AC

## 2012-11-07 MED ORDER — TRAMADOL HCL 50 MG PO TABS: 50.0000 mg | ORAL_TABLET | Freq: Two times a day (BID) | ORAL | Status: DC | PRN

## 2012-11-07 NOTE — Assessment & Plan Note (Signed)
Chronic. 

## 2012-11-07 NOTE — Assessment & Plan Note (Addendum)
Start Remeron in 1 wk Klonopin prn Rest more Wt Readings from Last 3 Encounters:  11/07/12 127 lb (57.607 kg)  07/07/11 129 lb 12.8 oz (58.877 kg)  05/11/11 132 lb (59.875 kg)

## 2012-11-07 NOTE — Assessment & Plan Note (Signed)
Start SL Vit B12

## 2012-11-07 NOTE — Assessment & Plan Note (Signed)
UA Toradol

## 2012-11-07 NOTE — Progress Notes (Signed)
Subjective:    Patient ID: [name], female    DOB: 1951-03-13, 62 y.o.   MRN: [id number]  Dysuria  This is a new problem. The current episode started yesterday. The quality of the pain is described as burning. The pain is at a severity of 7/10. The pain is severe. There has been no fever. Associated symptoms include flank pain, frequency and hematuria. Pertinent negatives include no vomiting. The treatment provided no relief.  Hematuria This is a new problem. The current episode started yesterday. The pain is severe. Irritative symptoms include frequency. Associated symptoms include abdominal pain, dysuria and flank pain. Pertinent negatives include no vomiting.  Anxiety Presents for initial visit. Onset was 1 to 4 weeks ago (2 wks). The problem has been waxing and waning. Symptoms include decreased concentration, depressed mood, insomnia and nervous/anxious behavior. Patient reports no shortness of breath or suicidal ideas. Symptoms occur constantly. The severity of symptoms is interfering with daily activities and moderate. The symptoms are aggravated by family issues and social activities. Hours of sleep per night: poor sleep. Nighttime awakenings: several.   There are no known risk factors. There is no history of anxiety/panic attacks, bipolar disorder, depression or suicide attempts. Past treatments include nothing.   Wt Readings from Last 3 Encounters:  11/07/12 127 lb (57.607 kg)  07/07/11 129 lb 12.8 oz (58.877 kg)  05/11/11 132 lb (59.875 kg)   BP Readings from Last 3 Encounters:  11/07/12 100/70  07/07/11 112/72  05/11/11 108/82       Review of Systems  Constitutional: Positive for fatigue.  HENT: Negative for congestion and neck pain.   Eyes: Negative for visual disturbance.  Respiratory: Negative for shortness of breath.   Gastrointestinal: Positive for abdominal pain. Negative for vomiting, constipation, blood in stool and abdominal distention.  Genitourinary:  Positive for dysuria, frequency, hematuria and flank pain. Negative for decreased urine volume.  Neurological: Positive for weakness.  Psychiatric/Behavioral: Positive for sleep disturbance and decreased concentration. Negative for suicidal ideas. The patient is nervous/anxious and has insomnia.        Objective:   Physical Exam  Constitutional: She appears well-developed and well-nourished. No distress.  Looks tired and feverish  HENT:  Head: Normocephalic.  Right Ear: External ear normal.  Left Ear: External ear normal.  Nose: Nose normal.  Mouth/Throat: Oropharynx is clear and moist.  Eyes: Conjunctivae are normal. Pupils are equal, round, and reactive to light. Right eye exhibits no discharge. Left eye exhibits no discharge.  Neck: Normal range of motion. Neck supple. No JVD present. No tracheal deviation present. No thyromegaly present.  Cardiovascular: Normal rate, regular rhythm and normal heart sounds.   Pulmonary/Chest: No stridor. No respiratory distress. She has no wheezes. She exhibits no tenderness.  Abdominal: Soft. Bowel sounds are normal. She exhibits no distension and no mass. There is tenderness (lower abd B). There is no rebound and no guarding.  Musculoskeletal: She exhibits no edema and no tenderness.  Lymphadenopathy:    She has no cervical adenopathy.  Neurological: She displays normal reflexes. No cranial nerve deficit. She exhibits normal muscle tone. Coordination normal.  Skin: No rash noted. No erythema.  Psychiatric: Her behavior is normal. Judgment and thought content normal.  A little sad     Lab Results  Component Value Date   WBC 9.7 11/07/2012   HGB 14.3 11/07/2012   HCT 41.5 11/07/2012   PLT 259.0 11/07/2012   GLUCOSE 107* 11/07/2012   CHOL 233* 03/02/2011   TRIG 34  03/02/2011   HDL 88 03/02/2011   LDLCALC 161* 03/02/2011   ALT 12 11/07/2012   AST 14 11/07/2012   NA 137 11/07/2012   K 4.2 11/07/2012   CL 99 11/07/2012   CREATININE 0.9 11/07/2012   BUN 13  11/07/2012   CO2 29 11/07/2012   TSH 1.60 11/07/2012   INR 0.80 03/01/2011   HGBA1C 5.6 03/01/2011        Assessment & Plan:

## 2012-11-07 NOTE — Progress Notes (Signed)
This encounter was created in error - please disregard.

## 2012-11-07 NOTE — Assessment & Plan Note (Addendum)
Labs 4/14 poss causes: stress, insomnia, B12 def Treat insomnia, B12 def Rest more RTC 2 wks

## 2012-11-08 ENCOUNTER — Ambulatory Visit (INDEPENDENT_AMBULATORY_CARE_PROVIDER_SITE_OTHER)
Admission: RE | Admit: 2012-11-08 | Discharge: 2012-11-08 | Disposition: A | Discharge status: Home / Self Care | Payer: Managed Care, Other (non HMO) | Source: Ambulatory Visit | Attending: Internal Medicine | Admitting: Internal Medicine

## 2012-11-08 ENCOUNTER — Other Ambulatory Visit: Payer: Self-pay | Admitting: Internal Medicine

## 2012-11-08 DIAGNOSIS — E559 Vitamin D deficiency, unspecified: Secondary | ICD-10-CM | POA: Insufficient documentation

## 2012-11-08 DIAGNOSIS — R109 Unspecified abdominal pain: Secondary | ICD-10-CM

## 2012-11-08 LAB — VITAMIN D 25 HYDROXY (VIT D DEFICIENCY, FRACTURES): Vit D, 25-Hydroxy: 32 ng/mL (ref 30–89)

## 2012-11-08 MED ORDER — ERGOCALCIFEROL 1.25 MG (50000 UT) PO CAPS: 50000.0000 [IU] | ORAL_CAPSULE | ORAL | Status: DC

## 2012-11-08 NOTE — Assessment & Plan Note (Signed)
Start Rx 

## 2012-11-16 ENCOUNTER — Encounter: Payer: Self-pay | Admitting: Internal Medicine

## 2012-11-16 ENCOUNTER — Ambulatory Visit (INDEPENDENT_AMBULATORY_CARE_PROVIDER_SITE_OTHER): Payer: Managed Care, Other (non HMO) | Admitting: Internal Medicine

## 2012-11-16 VITALS — BP 106/70 | HR 63 | Temp 97.8°F | Resp 16 | Wt 127.0 lb

## 2012-11-16 DIAGNOSIS — R300 Dysuria: Secondary | ICD-10-CM

## 2012-11-16 DIAGNOSIS — L559 Sunburn, unspecified: Secondary | ICD-10-CM

## 2012-11-16 DIAGNOSIS — E538 Deficiency of other specified B group vitamins: Secondary | ICD-10-CM

## 2012-11-16 DIAGNOSIS — Z733 Stress, not elsewhere classified: Secondary | ICD-10-CM

## 2012-11-16 DIAGNOSIS — F439 Reaction to severe stress, unspecified: Secondary | ICD-10-CM

## 2012-11-16 DIAGNOSIS — F4323 Adjustment disorder with mixed anxiety and depressed mood: Secondary | ICD-10-CM

## 2012-11-16 DIAGNOSIS — E559 Vitamin D deficiency, unspecified: Secondary | ICD-10-CM

## 2012-11-16 MED ORDER — CYANOCOBALAMIN 1000 MCG/ML IJ SOLN
1000.0000 ug | Freq: Once | INTRAMUSCULAR | Status: AC
  Administered 2012-11-16: 1000 ug via INTRAMUSCULAR

## 2012-11-16 MED ORDER — TRIAMCINOLONE ACETONIDE 0.5 % EX CREA: TOPICAL_CREAM | Freq: Three times a day (TID) | CUTANEOUS | Status: DC

## 2012-11-16 NOTE — Assessment & Plan Note (Signed)
Triamc prn 

## 2012-11-16 NOTE — Assessment & Plan Note (Signed)
Nausea w/Vit D May need to use monthly Rx

## 2012-11-16 NOTE — Assessment & Plan Note (Signed)
Nauseated w/po meds Options: sq shots and nasal

## 2012-11-16 NOTE — Progress Notes (Signed)
   Subjective:   F/u UTI - resolved F/u insomnia - better; fatigue - better. Not taking Remeron  Anxiety Presents for follow-up visit. Onset was 1 to 4 weeks ago (2 wks). The problem has been waxing and waning. Symptoms include depressed mood, insomnia and nervous/anxious behavior. Patient reports no decreased concentration, shortness of breath or suicidal ideas. Symptoms occur occasionally. The severity of symptoms is interfering with daily activities and moderate. The symptoms are aggravated by family issues and social activities. Hours of sleep per night: poor sleep. Sleep quality: better. Nighttime awakenings: several.   There are no known risk factors. There is no history of anxiety/panic attacks, bipolar disorder, depression or suicide attempts. Past treatments include nothing.   Wt Readings from Last 3 Encounters:  11/16/12 127 lb (57.607 kg)  11/07/12 127 lb (57.607 kg)  07/07/11 129 lb 12.8 oz (58.877 kg)   BP Readings from Last 3 Encounters:  11/16/12 106/70  11/07/12 100/70  07/07/11 112/72       Review of Systems  Constitutional: Negative for fatigue.  HENT: Negative for congestion and neck pain.   Eyes: Negative for visual disturbance.  Respiratory: Negative for shortness of breath.   Gastrointestinal: Negative for constipation, blood in stool and abdominal distention.  Genitourinary: Negative for decreased urine volume.  Neurological: Negative for weakness.  Psychiatric/Behavioral: Positive for sleep disturbance. Negative for suicidal ideas and decreased concentration. The patient is nervous/anxious and has insomnia.        Objective:   Physical Exam  Constitutional: She appears well-developed and well-nourished. No distress.  Looks tired and feverish  HENT:  Head: Normocephalic.  Right Ear: External ear normal.  Left Ear: External ear normal.  Nose: Nose normal.  Mouth/Throat: Oropharynx is clear and moist.  Eyes: Conjunctivae are normal. Pupils are  equal, round, and reactive to light. Right eye exhibits no discharge. Left eye exhibits no discharge.  Neck: Normal range of motion. Neck supple. No JVD present. No tracheal deviation present. No thyromegaly present.  Cardiovascular: Normal rate, regular rhythm and normal heart sounds.   Pulmonary/Chest: No stridor. No respiratory distress. She has no wheezes. She exhibits no tenderness.  Abdominal: Soft. Bowel sounds are normal. She exhibits no distension and no mass. There is no tenderness. There is no rebound and no guarding.  Musculoskeletal: She exhibits no edema and no tenderness.  Lymphadenopathy:    She has no cervical adenopathy.  Neurological: She displays normal reflexes. No cranial nerve deficit. She exhibits normal muscle tone. Coordination normal.  Skin: No rash noted. No erythema.  Psychiatric: Her behavior is normal. Judgment and thought content normal.  WNL  sunburn on face   Lab Results  Component Value Date   WBC 9.7 11/07/2012   HGB 14.3 11/07/2012   HCT 41.5 11/07/2012   PLT 259.0 11/07/2012   GLUCOSE 107* 11/07/2012   CHOL 233* 03/02/2011   TRIG 34 03/02/2011   HDL 88 03/02/2011   LDLCALC 161* 03/02/2011   ALT 12 11/07/2012   AST 14 11/07/2012   NA 137 11/07/2012   K 4.2 11/07/2012   CL 99 11/07/2012   CREATININE 0.9 11/07/2012   BUN 13 11/07/2012   CO2 29 11/07/2012   TSH 1.60 11/07/2012   INR 0.80 03/01/2011   HGBA1C 5.6 03/01/2011   CT abd - normal     Assessment & Plan:

## 2012-11-16 NOTE — Assessment & Plan Note (Signed)
Resolved

## 2012-11-16 NOTE — Assessment & Plan Note (Signed)
Better  

## 2013-01-29 ENCOUNTER — Ambulatory Visit (INDEPENDENT_AMBULATORY_CARE_PROVIDER_SITE_OTHER): Payer: Managed Care, Other (non HMO)

## 2013-01-29 ENCOUNTER — Telehealth: Payer: Self-pay

## 2013-01-29 DIAGNOSIS — N390 Urinary tract infection, site not specified: Secondary | ICD-10-CM

## 2013-01-29 DIAGNOSIS — E538 Deficiency of other specified B group vitamins: Secondary | ICD-10-CM

## 2013-01-29 MED ORDER — CYANOCOBALAMIN 1000 MCG/ML IJ SOLN
1000.0000 ug | Freq: Once | INTRAMUSCULAR | Status: AC
  Administered 2013-01-29: 1000 ug via INTRAMUSCULAR

## 2013-01-29 NOTE — Telephone Encounter (Signed)
Patient came in for a nurse visit for a b-12 injection. She stated she's had 2 uti's in 3 months and wants to know can you just sent her a prescription she can take daily or have on hand when she gets them. Or do you need to see her? She was prescribed medication by a family friend. Please advise. Thanks.

## 2013-01-29 NOTE — Telephone Encounter (Signed)
Frequent UTIs (more than 3 per year) should be evaluated, generally by a urologist to be sure there is no anatomic problem or underlying disease state.   Happy to refer to urology

## 2013-01-29 NOTE — Telephone Encounter (Signed)
pcc notified

## 2013-01-29 NOTE — Telephone Encounter (Signed)
Phone call back to patient letting her know what Dr Debby Bud suggests. She would like to be referred to [facility].

## 2013-02-13 ENCOUNTER — Other Ambulatory Visit: Payer: Self-pay | Admitting: Plastic Surgery

## 2013-02-21 ENCOUNTER — Other Ambulatory Visit: Payer: Self-pay | Admitting: Plastic Surgery

## 2013-02-28 ENCOUNTER — Ambulatory Visit (INDEPENDENT_AMBULATORY_CARE_PROVIDER_SITE_OTHER): Payer: Managed Care, Other (non HMO)

## 2013-02-28 DIAGNOSIS — E538 Deficiency of other specified B group vitamins: Secondary | ICD-10-CM

## 2013-02-28 MED ORDER — CYANOCOBALAMIN 1000 MCG/ML IJ SOLN
1000.0000 ug | Freq: Once | INTRAMUSCULAR | Status: AC
  Administered 2013-02-28: 1000 ug via INTRAMUSCULAR

## 2013-03-28 ENCOUNTER — Ambulatory Visit (INDEPENDENT_AMBULATORY_CARE_PROVIDER_SITE_OTHER): Payer: Managed Care, Other (non HMO)

## 2013-03-28 DIAGNOSIS — E538 Deficiency of other specified B group vitamins: Secondary | ICD-10-CM

## 2013-03-28 MED ORDER — CYANOCOBALAMIN 1000 MCG/ML IJ SOLN
1000.0000 ug | Freq: Once | INTRAMUSCULAR | Status: AC
  Administered 2013-03-28: 1000 ug via INTRAMUSCULAR

## 2013-04-24 ENCOUNTER — Ambulatory Visit (INDEPENDENT_AMBULATORY_CARE_PROVIDER_SITE_OTHER): Payer: Managed Care, Other (non HMO)

## 2013-04-24 DIAGNOSIS — E538 Deficiency of other specified B group vitamins: Secondary | ICD-10-CM

## 2013-04-24 MED ORDER — CYANOCOBALAMIN 1000 MCG/ML IJ SOLN
1000.0000 ug | Freq: Once | INTRAMUSCULAR | Status: AC
  Administered 2013-04-24: 1000 ug via INTRAMUSCULAR

## 2013-04-26 ENCOUNTER — Ambulatory Visit: Payer: Managed Care, Other (non HMO)

## 2013-05-28 ENCOUNTER — Ambulatory Visit (INDEPENDENT_AMBULATORY_CARE_PROVIDER_SITE_OTHER): Payer: Managed Care, Other (non HMO)

## 2013-05-28 DIAGNOSIS — E538 Deficiency of other specified B group vitamins: Secondary | ICD-10-CM

## 2013-05-28 MED ORDER — CYANOCOBALAMIN 1000 MCG/ML IJ SOLN
1000.0000 ug | Freq: Once | INTRAMUSCULAR | Status: AC
  Administered 2013-05-28: 1000 ug via INTRAMUSCULAR

## 2013-05-30 ENCOUNTER — Other Ambulatory Visit: Payer: Self-pay

## 2013-06-18 ENCOUNTER — Telehealth: Payer: Self-pay | Admitting: Internal Medicine

## 2013-06-18 NOTE — Telephone Encounter (Signed)
Patient Information:  Caller Name: Khole  Phone: [phone]  Patient: [name], [name]  Gender: Female  DOB: [birth year]  Age: 62 Years  PCP: Illene Regulus (Adults only)  Office Follow Up:  Does the office need to follow up with this patient?: No  Instructions For The Office: N/A  RN Note:  Scheduled tomorrow 06/19/13 at 11;15 with Dr.Jones.  Encourged her to follow up with her Allergist as well. She has phone call placed and waiting for call back.  Care advice and call back parameters reviewed. Understnading expresssed.  Symptoms  Reason For Call & Symptoms: Patient states that she is being treated by [facility] for ongoing- ear infection, throat infection and sinus infection. 04/22/13 and 06/07/13.  She states throat is constantly itching and having coughing attacks. Productive green mucus, voice hoarse and throat is dry, Afebrile.   She finished all antibiotics on 06/09/13 second round.  Reviewed Health History In EMR: Yes  Reviewed Medications In EMR: Yes  Reviewed Allergies In EMR: Yes  Reviewed Surgeries / Procedures: Yes  Date of Onset of Symptoms: 04/22/2013  Treatments Tried: two round of antibiotics,  nasonex, allergy pill and advil, Delsym  Treatments Tried Worked: No  Guideline(s) Used:  Cough  Disposition Per Guideline:   See Today or Tomorrow in Office  Reason For Disposition Reached:   Patient wants to be seen  Advice Given:  Cough Medicines:  Home Remedy - Hard Candy: Hard candy works just as well as medicine-flavored OTC cough drops. Diabetics should use sugar-free candy.  Home Remedy - Honey: This old home remedy has been shown to help decrease coughing at night. The adult dosage is 2 teaspoons (10 ml) at bedtime. Honey should not be given to infants under one year of age.  Coughing Spasms:  Drink warm fluids. Inhale warm mist (Reason: both relax the airway and loosen up the phlegm).  Suck on cough drops or hard candy to coat the irritated  throat.  Prevent Dehydration:  Drink adequate liquids.  This will help soothe an irritated or dry throat and loosen up the phlegm.  Avoid Tobacco Smoke:  Smoking or being exposed to smoke makes coughs much worse.  Call Back If:  Difficulty breathing  Cough lasts more than 3 weeks  Fever lasts > 3 days  You become worse.  Patient Will Follow Care Advice:  YES  Appointment Scheduled:  06/19/2013 00:00:00 Appointment Scheduled Provider:  Sanda Linger (Adults only)

## 2013-06-19 ENCOUNTER — Encounter: Payer: Self-pay | Admitting: Internal Medicine

## 2013-06-19 ENCOUNTER — Ambulatory Visit (INDEPENDENT_AMBULATORY_CARE_PROVIDER_SITE_OTHER): Payer: Managed Care, Other (non HMO) | Admitting: Internal Medicine

## 2013-06-19 ENCOUNTER — Other Ambulatory Visit (INDEPENDENT_AMBULATORY_CARE_PROVIDER_SITE_OTHER): Payer: Managed Care, Other (non HMO)

## 2013-06-19 ENCOUNTER — Ambulatory Visit (INDEPENDENT_AMBULATORY_CARE_PROVIDER_SITE_OTHER)
Admission: RE | Admit: 2013-06-19 | Discharge: 2013-06-19 | Disposition: A | Discharge status: Home / Self Care | Payer: Managed Care, Other (non HMO) | Source: Ambulatory Visit | Attending: Internal Medicine | Admitting: Internal Medicine

## 2013-06-19 VITALS — BP 110/72 | HR 71 | Temp 98.2°F | Resp 16

## 2013-06-19 DIAGNOSIS — E538 Deficiency of other specified B group vitamins: Secondary | ICD-10-CM

## 2013-06-19 DIAGNOSIS — R059 Cough, unspecified: Secondary | ICD-10-CM

## 2013-06-19 DIAGNOSIS — R7309 Other abnormal glucose: Secondary | ICD-10-CM

## 2013-06-19 DIAGNOSIS — R05 Cough: Secondary | ICD-10-CM

## 2013-06-19 DIAGNOSIS — R5381 Other malaise: Secondary | ICD-10-CM

## 2013-06-19 LAB — VITAMIN B12: Vitamin B-12: 1368 pg/mL — ABNORMAL HIGH (ref 211–911)

## 2013-06-19 LAB — CBC WITH DIFFERENTIAL/PLATELET
Basophils Relative: 0.4 % (ref 0.0–3.0)
Eosinophils Relative: 0.6 % (ref 0.0–5.0)
HCT: 42.1 % (ref 36.0–46.0)
Hemoglobin: 14.4 g/dL (ref 12.0–15.0)
Lymphocytes Relative: 19.1 % (ref 12.0–46.0)
Lymphs Abs: 1.6 10*3/uL (ref 0.7–4.0)
MCV: 91 fl (ref 78.0–100.0)
Monocytes Absolute: 0.6 10*3/uL (ref 0.1–1.0)
Monocytes Relative: 7 % (ref 3.0–12.0)
Neutro Abs: 6.1 10*3/uL (ref 1.4–7.7)
RBC: 4.63 Mil/uL (ref 3.87–5.11)
WBC: 8.4 10*3/uL (ref 4.5–10.5)

## 2013-06-19 LAB — COMPREHENSIVE METABOLIC PANEL
Alkaline Phosphatase: 74 U/L (ref 39–117)
BUN: 17 mg/dL (ref 6–23)
CO2: 29 mEq/L (ref 19–32)
GFR: 81.25 mL/min (ref >60.00)
Glucose, Bld: 96 mg/dL (ref 70–99)
Total Bilirubin: 1.2 mg/dL (ref 0.3–1.2)

## 2013-06-19 LAB — TSH: TSH: 1.9 u[IU]/mL (ref 0.35–5.50)

## 2013-06-19 LAB — HEMOGLOBIN A1C: Hgb A1c MFr Bld: 5.7 % (ref 4.6–6.5)

## 2013-06-19 LAB — C-REACTIVE PROTEIN: CRP: 0.5 mg/dL (ref 0.5–20.0)

## 2013-06-19 LAB — SEDIMENTATION RATE: Sed Rate: 10 mm/hr (ref 0–22)

## 2013-06-19 MED ORDER — BUDESONIDE 180 MCG/ACT IN AEPB: 1.0000 | INHALATION_SPRAY | Freq: Two times a day (BID) | RESPIRATORY_TRACT | Status: DC

## 2013-06-19 MED ORDER — PROMETHAZINE-CODEINE 6.25-10 MG/5ML PO SYRP: 5.0000 mL | ORAL_SOLUTION | ORAL | Status: AC | PRN

## 2013-06-19 NOTE — Assessment & Plan Note (Signed)
I will recheck her labs to see if there is a metabolic cause for the fatigue

## 2013-06-19 NOTE — Progress Notes (Signed)
Subjective:    Patient ID: [name], female    DOB: January 24, 1951, 62 y.o.   MRN: [id number]  HPI Comments: She has persistent cough, she has seen an allergist twice and has tried a Zpak, cefdinir, depo-medrol injection, otc cough suppressant, nasonex, and allegra.  Cough This is a recurrent problem. Episode onset: for 2 months. The problem has been unchanged. The problem occurs every few hours. The cough is productive of sputum (she coughs up white phlegm). Associated symptoms include chills, nasal congestion, postnasal drip and rhinorrhea. Pertinent negatives include no chest pain, ear congestion, ear pain, fever, headaches, heartburn, hemoptysis, myalgias, rash, sore throat, shortness of breath, sweats, weight loss or wheezing. Nothing aggravates the symptoms. She has tried oral steroids for the symptoms. The treatment provided mild relief.      Review of Systems  Constitutional: Positive for chills and fatigue. Negative for fever, weight loss, diaphoresis, activity change, appetite change and unexpected weight change.  HENT: Positive for congestion, postnasal drip and rhinorrhea. Negative for ear pain, mouth sores, nosebleeds, sinus pressure, sore throat, tinnitus, trouble swallowing and voice change.   Eyes: Negative.   Respiratory: Positive for cough. Negative for apnea, hemoptysis, choking, shortness of breath, wheezing and stridor.   Cardiovascular: Negative.  Negative for chest pain, palpitations and leg swelling.  Gastrointestinal: Negative.  Negative for heartburn, nausea, abdominal pain, diarrhea, constipation and blood in stool.  Endocrine: Negative.   Genitourinary: Negative.   Musculoskeletal: Negative.  Negative for myalgias.  Skin: Negative.  Negative for rash.  Allergic/Immunologic: Negative.   Neurological: Negative.  Negative for dizziness, tremors, weakness, light-headedness and headaches.  Hematological: Negative.  Negative for adenopathy. Does not bruise/bleed  easily.  Psychiatric/Behavioral: Negative.        Objective:   Physical Exam  Vitals reviewed. Constitutional: She is oriented to person, place, and time. She appears well-developed and well-nourished.  Non-toxic appearance. She does not have a sickly appearance. She does not appear ill. No distress.  HENT:  Head: Normocephalic and atraumatic.  Right Ear: Hearing, tympanic membrane, external ear and ear canal normal.  Left Ear: Hearing, tympanic membrane, external ear and ear canal normal.  Nose: Nose normal. No mucosal edema, rhinorrhea or septal deviation. Right sinus exhibits no maxillary sinus tenderness and no frontal sinus tenderness. Left sinus exhibits no maxillary sinus tenderness.  Mouth/Throat: Oropharynx is clear and moist and mucous membranes are normal. Mucous membranes are not pale, not dry and not cyanotic. No oral lesions. No trismus in the jaw. No uvula swelling. No oropharyngeal exudate, posterior oropharyngeal edema, posterior oropharyngeal erythema or tonsillar abscesses.  Eyes: Conjunctivae are normal. Right eye exhibits no discharge. Left eye exhibits no discharge. No scleral icterus.  Neck: Normal range of motion. Neck supple. No JVD present. No tracheal deviation present. No thyromegaly present.  Cardiovascular: Normal rate, regular rhythm, normal heart sounds and intact distal pulses.  Exam reveals no gallop and no friction rub.   No murmur heard. Pulmonary/Chest: Effort normal and breath sounds normal. No stridor. No respiratory distress. She has no wheezes. She has no rales. She exhibits no tenderness.  Abdominal: Soft. Bowel sounds are normal. She exhibits no distension and no mass. There is no tenderness. There is no rebound and no guarding.  Musculoskeletal: Normal range of motion. She exhibits no edema and no tenderness.  Lymphadenopathy:    She has no cervical adenopathy.  Neurological: She is oriented to person, place, and time.  Skin: Skin is warm and dry.  No rash  noted. She is not diaphoretic. No erythema. No pallor.     Lab Results  Component Value Date   WBC 9.7 11/07/2012   HGB 14.3 11/07/2012   HCT 41.5 11/07/2012   PLT 259.0 11/07/2012   GLUCOSE 107* 11/07/2012   CHOL 233* 03/02/2011   TRIG 34 03/02/2011   HDL 88 03/02/2011   LDLCALC 324* 03/02/2011   ALT 12 11/07/2012   AST 14 11/07/2012   NA 137 11/07/2012   K 4.2 11/07/2012   CL 99 11/07/2012   CREATININE 0.9 11/07/2012   BUN 13 11/07/2012   CO2 29 11/07/2012   TSH 1.60 11/07/2012   INR 0.80 03/01/2011   HGBA1C 5.6 03/01/2011       Assessment & Plan:

## 2013-06-19 NOTE — Assessment & Plan Note (Signed)
I think she has a persistent cough s/p viral URI with possible asthma so I have asked her to start ICS - she was given samples of pulmicort and was instructed on its use and she demonstrated proficiency with itsuse, I have also askedher to use a cough suppressant. She has been on two antibacterials so I don't think she has a bacterial infection but will check a CXR to look for PNA. I will check her labs to look llergic phenomenon with a CBC (?eos) and ESR/CRP.

## 2013-06-19 NOTE — Patient Instructions (Signed)

## 2013-06-19 NOTE — Progress Notes (Signed)
Pre visit review using our clinic review tool, if applicable. No additional management support is needed unless otherwise documented below in the visit note. 

## 2013-06-19 NOTE — Assessment & Plan Note (Signed)
I will check an A1C to screen her for DM2

## 2013-06-26 ENCOUNTER — Encounter: Payer: Self-pay | Admitting: Internal Medicine

## 2013-06-26 ENCOUNTER — Ambulatory Visit (INDEPENDENT_AMBULATORY_CARE_PROVIDER_SITE_OTHER): Payer: Managed Care, Other (non HMO) | Admitting: Internal Medicine

## 2013-06-26 VITALS — BP 90/70 | HR 70 | Temp 98.4°F | Wt 127.2 lb

## 2013-06-26 DIAGNOSIS — Z0000 Encounter for general adult medical examination without abnormal findings: Secondary | ICD-10-CM

## 2013-06-26 DIAGNOSIS — E538 Deficiency of other specified B group vitamins: Secondary | ICD-10-CM

## 2013-06-26 DIAGNOSIS — Z2911 Encounter for prophylactic immunotherapy for respiratory syncytial virus (RSV): Secondary | ICD-10-CM

## 2013-06-26 DIAGNOSIS — E559 Vitamin D deficiency, unspecified: Secondary | ICD-10-CM

## 2013-06-26 DIAGNOSIS — Z23 Encounter for immunization: Secondary | ICD-10-CM

## 2013-06-26 NOTE — Progress Notes (Signed)
Pre visit review using our clinic review tool, if applicable. No additional management support is needed unless otherwise documented below in the visit note. 

## 2013-06-26 NOTE — Assessment & Plan Note (Signed)
She has completed D3 50,000 weekly x 4 weeks  Plan otc Vit D 1,000 iu daily

## 2013-06-26 NOTE — Addendum Note (Signed)
Addended by: Lyanne Co R on: 06/26/2013 11:14 AM   Modules accepted: Orders

## 2013-06-26 NOTE — Patient Instructions (Signed)
I am glad that you are feeling better. It is ok to continue with the Symbicort until you feel your breathing is back to normal. During the day any cough syrup with DM (dextromethorophan) and guafenesin (mucinex) is good saving the codeine cough syrup for bedtime if needed.  Your last B12 was higher than normal range: continue the shots but you do not need a B12 oral replacement.  Your last Vit D level was 32 - low normal. For maintenance therapy take Vit D 1,ooo iu daily.  In regard to continuity of care I will request that Dr. Posey Rea becomes your PCP.

## 2013-06-26 NOTE — Progress Notes (Signed)
   Subjective:    Patient ID: [name], female    DOB: 1951-05-02, 62 y.o.   MRN: [id number]  HPI [name] was seen by Dr. Yetta Barre last week and diagnosed with bronchitis. She has had an illness for several weeks - had two rounds of antibiotics. At the last visit she was started on Symbicort. Reviewed Chart - on X-ray she has hyperinflation. She has never had pulmonary problems and has never had Pulmonary testing. In August '12 she had CT chest - no COPD or underlying problems. Symbicort is giving her good relief.  Reviewed B12 level - last study was supranormal.  Vit D in April '14 was 32  Discussed transition to Dr. Posey Rea after my retirement.   PMH, FamHx and SocHx reviewed for any changes and relevance.  Current Outpatient Prescriptions on File Prior to Visit  Medication Sig Dispense Refill  . budesonide (PULMICORT FLEXHALER) 180 MCG/ACT inhaler Inhale 1 puff into the lungs 2 (two) times daily.  1 each  0  . cholecalciferol (VITAMIN D) 1000 UNITS tablet Take 1 tablet (1,000 Units total) by mouth daily.  100 tablet  3  . Cyanocobalamin (VITAMIN B-12) 1000 MCG SUBL Place 1 tablet (1,000 mcg total) under the tongue daily.  100 tablet  3  . ergocalciferol (VITAMIN D2) 50000 UNITS capsule Take 1 capsule (50,000 Units total) by mouth once a week.  4 capsule  0  . promethazine-codeine (PHENERGAN WITH CODEINE) 6.25-10 MG/5ML syrup Take 5 mLs by mouth every 4 (four) hours as needed for cough.  240 mL  0  . triamcinolone cream (KENALOG) 0.5 % Apply topically 3 (three) times daily.  30 g  0   No current facility-administered medications on file prior to visit.      Review of Systems System review is negative for any constitutional, cardiac, pulmonary, GI or neuro symptoms or complaints other than as described in the HPI.     Objective:   Physical Exam Filed Vitals:   06/26/13 1020  BP: 90/70  Pulse: 70  Temp: 98.4 F (36.9 C)   gen'l- WNWD woman in no distress HEENT - C&S  clear Cor - RRR PUlm - CTAP, w/o wheezing Neuro - normal       Assessment & Plan:  Bronchitis - resolving Plan May continue Symbicort until clear  Recommend daytime Robitussin DM, codeine syrup at Bedtime

## 2013-06-26 NOTE — Assessment & Plan Note (Signed)
Level >1300. Instructed to have B12 shots monthly but to stop oral replacement

## 2013-06-26 NOTE — Assessment & Plan Note (Signed)
Discussed immunizations - she is given Tdap today and shingles vaccine. Due for pneumonia vaccines at 67.

## 2013-06-27 ENCOUNTER — Ambulatory Visit: Payer: Managed Care, Other (non HMO)

## 2013-07-29 ENCOUNTER — Ambulatory Visit (INDEPENDENT_AMBULATORY_CARE_PROVIDER_SITE_OTHER): Payer: Managed Care, Other (non HMO) | Admitting: *Deleted

## 2013-07-29 DIAGNOSIS — E538 Deficiency of other specified B group vitamins: Secondary | ICD-10-CM

## 2013-07-29 MED ORDER — CYANOCOBALAMIN 1000 MCG/ML IJ SOLN
1000.0000 ug | Freq: Once | INTRAMUSCULAR | Status: AC
  Administered 2013-07-29: 1000 ug via INTRAMUSCULAR

## 2013-08-27 ENCOUNTER — Ambulatory Visit (INDEPENDENT_AMBULATORY_CARE_PROVIDER_SITE_OTHER): Payer: Managed Care, Other (non HMO) | Admitting: *Deleted

## 2013-08-27 DIAGNOSIS — E538 Deficiency of other specified B group vitamins: Secondary | ICD-10-CM

## 2013-08-27 MED ORDER — CYANOCOBALAMIN 1000 MCG/ML IJ SOLN
1000.0000 ug | Freq: Once | INTRAMUSCULAR | Status: AC
  Administered 2013-08-27: 1000 ug via INTRAMUSCULAR

## 2013-10-01 ENCOUNTER — Ambulatory Visit (INDEPENDENT_AMBULATORY_CARE_PROVIDER_SITE_OTHER): Payer: Managed Care, Other (non HMO) | Admitting: *Deleted

## 2013-10-01 DIAGNOSIS — E538 Deficiency of other specified B group vitamins: Secondary | ICD-10-CM

## 2013-10-01 MED ORDER — CYANOCOBALAMIN 1000 MCG/ML IJ SOLN
1000.0000 ug | Freq: Once | INTRAMUSCULAR | Status: AC
Start: 2013-10-01 — End: 2013-10-01
  Administered 2013-10-01: 1000 ug via INTRAMUSCULAR

## 2013-10-29 ENCOUNTER — Ambulatory Visit (INDEPENDENT_AMBULATORY_CARE_PROVIDER_SITE_OTHER): Payer: Managed Care, Other (non HMO) | Admitting: *Deleted

## 2013-10-29 DIAGNOSIS — E538 Deficiency of other specified B group vitamins: Secondary | ICD-10-CM

## 2013-10-29 MED ORDER — CYANOCOBALAMIN 1000 MCG/ML IJ SOLN
1000.0000 ug | Freq: Once | INTRAMUSCULAR | Status: AC
  Administered 2013-10-29: 1000 ug via INTRAMUSCULAR

## 2013-11-26 ENCOUNTER — Ambulatory Visit (INDEPENDENT_AMBULATORY_CARE_PROVIDER_SITE_OTHER): Payer: Managed Care, Other (non HMO) | Admitting: *Deleted

## 2013-11-26 DIAGNOSIS — E538 Deficiency of other specified B group vitamins: Secondary | ICD-10-CM

## 2013-11-26 MED ORDER — CYANOCOBALAMIN 1000 MCG/ML IJ SOLN
1000.0000 ug | Freq: Once | INTRAMUSCULAR | Status: AC
  Administered 2013-11-26: 1000 ug via INTRAMUSCULAR

## 2013-12-26 ENCOUNTER — Ambulatory Visit (INDEPENDENT_AMBULATORY_CARE_PROVIDER_SITE_OTHER): Payer: Managed Care, Other (non HMO)

## 2013-12-26 DIAGNOSIS — E538 Deficiency of other specified B group vitamins: Secondary | ICD-10-CM

## 2013-12-26 MED ORDER — CYANOCOBALAMIN 1000 MCG/ML IJ SOLN
1000.0000 ug | Freq: Once | INTRAMUSCULAR | Status: AC
  Administered 2013-12-26: 1000 ug via INTRAMUSCULAR

## 2014-01-28 ENCOUNTER — Ambulatory Visit: Payer: Managed Care, Other (non HMO)

## 2014-04-15 ENCOUNTER — Ambulatory Visit (INDEPENDENT_AMBULATORY_CARE_PROVIDER_SITE_OTHER): Payer: Managed Care, Other (non HMO) | Admitting: Nurse Practitioner

## 2014-04-15 ENCOUNTER — Other Ambulatory Visit: Payer: Self-pay

## 2014-04-15 ENCOUNTER — Encounter: Payer: Self-pay | Admitting: Nurse Practitioner

## 2014-04-15 VITALS — BP 100/76 | HR 70 | Temp 98.8°F | Ht 64.0 in | Wt 129.1 lb

## 2014-04-15 DIAGNOSIS — J029 Acute pharyngitis, unspecified: Secondary | ICD-10-CM

## 2014-04-15 DIAGNOSIS — R05 Cough: Secondary | ICD-10-CM

## 2014-04-15 DIAGNOSIS — J209 Acute bronchitis, unspecified: Secondary | ICD-10-CM

## 2014-04-15 DIAGNOSIS — R059 Cough, unspecified: Secondary | ICD-10-CM

## 2014-04-15 DIAGNOSIS — J321 Chronic frontal sinusitis: Secondary | ICD-10-CM

## 2014-04-15 MED ORDER — AMOXICILLIN-POT CLAVULANATE 875-125 MG PO TABS: 1.0000 | ORAL_TABLET | Freq: Two times a day (BID) | ORAL | Status: DC

## 2014-04-15 MED ORDER — BECLOMETHASONE DIPROPIONATE 80 MCG/ACT IN AERS: 2.0000 | INHALATION_SPRAY | Freq: Two times a day (BID) | RESPIRATORY_TRACT | Status: DC

## 2014-04-15 MED ORDER — PROMETHAZINE-CODEINE 6.25-10 MG/5ML PO SYRP: 5.0000 mL | ORAL_SOLUTION | ORAL | Status: AC | PRN

## 2014-04-15 NOTE — Progress Notes (Signed)
Subjective:    Patient ID: [name], female    DOB: Oct 23, 1950, 63 y.o.   MRN: [id number]  HPI  Patient is seen for 5 day history of fatigue, headache, pressure in ears, watery eyes, sore throat and cough which is progressively worse at night.  Patient states she is unable to sleep due to coughing.  Cough productive of green colored phlegm at times. Denies shortness of breath, chest pain, pressure, or palpitations.     Review of Systems  Constitutional: Positive for chills, activity change and fatigue. Negative for fever.  HENT: Positive for congestion, ear pain, postnasal drip, sinus pressure and sore throat. Negative for ear discharge.   Eyes: Positive for itching.  Respiratory: Positive for cough and chest tightness. Negative for shortness of breath and wheezing.   Cardiovascular: Negative for chest pain, palpitations and leg swelling.  Skin: Negative.   Neurological: Positive for headaches.   Past Medical History  Diagnosis Date  . IBS (irritable bowel syndrome)   . Duodenitis 2006  . GERD (gastroesophageal reflux disease)     History   Social History  . Marital Status: Married    Spouse Name: Keith Rake    Number of Children: 3  . Years of Education: 16   Occupational History  . homemaker    Social History Main Topics  . Smoking status: Never Smoker   . Smokeless tobacco: Never Used  . Alcohol Use: 1.0 oz/week    2 drink(s) per week     Comment: socially 3 x a week  . Drug Use: No  . Sexual Activity: Yes    Partners: Male   Other Topics Concern  . Not on file   Social History Narrative   HSG, [employer]. Married '78  1 dtr - '79, 2 sons ' 82, '87. Work - worked in the [employer], now [employer]. Marriage in good health. No history of abuse. ACP-has a living will: yes - CPR, yes - short-term mechanical ventilation, no futile or heroic measures in the face of a poor prognosis.    Past Surgical History  Procedure Laterality Date  .  Cesarean section      x 3  . Cholecystectomy  03/2006  . Anterior fusion cervical spine  04/2007  . Facial cosmetic surgery    . Tubal ligation  1988  . Pelvic bone cyst resection    . Flexor tendon of forearm / wrist repair  '05    Dr. Teressa Senter for tennis injury-left forearm    Family History  Problem Relation Age of Onset  . Stroke Maternal Grandmother   . Breast cancer Maternal Grandmother   . Crohn's disease Sister   . Breast cancer Maternal Aunt   . Irritable bowel syndrome Sister   . Heart disease Sister     had rheumatic fever  . Hypertension Sister   . Colon cancer Neg Hx   . Diabetes Neg Hx   . Heart disease Mother     SSS with PTVDP  . Arthritis Mother   . Hypertension Father   . Alzheimer's disease Father     No Known Allergies  Current Outpatient Prescriptions on File Prior to Visit  Medication Sig Dispense Refill  . Cyanocobalamin (VITAMIN B-12) 1000 MCG SUBL Place 1 tablet (1,000 mcg total) under the tongue daily.  100 tablet  3  . ergocalciferol (VITAMIN D2) 50000 UNITS capsule Take 1 capsule (50,000 Units total) by mouth once a week.  4 capsule  0  .  triamcinolone cream (KENALOG) 0.5 % Apply topically 3 (three) times daily.  30 g  0   No current facility-administered medications on file prior to visit.    BP 100/76  Pulse 70  Temp(Src) 98.8 F (37.1 C) (Oral)  Ht  (1.626 m)  Wt 129 lb 1.9 oz (58.568 kg)  BMI 22.15 kg/m2  SpO2 97%         Objective:   Physical Exam  Constitutional: She is oriented to person, place, and time. She appears well-developed and well-nourished. No distress.  HENT:  Head: Normocephalic.  Right Ear: External ear normal.  Left Ear: External ear normal.  Oropharynx erythematous, with moderate amount of clear to whitish drainage noted posteriorly.  No lymphadenopathy.   Eyes: Pupils are equal, round, and reactive to light.  Neck: Neck supple.  Cardiovascular: Normal rate, regular rhythm and normal heart sounds.     Pulmonary/Chest: Effort normal. No respiratory distress. She has wheezes. She has no rales.   Few expiratory wheezes with deep cough. No rhonchi or rales  Lymphadenopathy:    She has no cervical adenopathy.  Neurological: She is alert and oriented to person, place, and time.  Skin: Skin is warm and dry.  Psychiatric: She has a normal mood and affect.          Assessment & Plan:  1. Chronic frontal sinusitis  2. Acute bronchitis, unspecified organism Rest, adequate fluid intake. - amoxicillin-clavulanate (AUGMENTIN) 875-125 MG per tablet; Take 1 tablet by mouth 2 (two) times daily.  Dispense: 14 tablet; Refill: 0  3. Acute pharyngitis, unspecified pharyngitis type  4. Cough Phenergan with codeine cough suppressant prescribed  - beclomethasone (QVAR) 80 MCG/ACT inhaler; Inhale 2 puffs into the lungs 2 (two) times daily.  Dispense: 1 Inhaler; Refill: 0 Follow up in 1 week for re evaluation Call clinic if symptoms worsen or not improved  Go to ER symptoms of shortness of breath not relieved.

## 2014-04-15 NOTE — Progress Notes (Signed)
Pre visit review using our clinic review tool, if applicable. No additional management support is needed unless otherwise documented below in the visit note. 

## 2014-04-15 NOTE — Patient Instructions (Signed)
Rest Adequate fluid intake Take phenergan with codeine cough medication as prescribed.  Caution as this may cause drowsiness Use in haler 1-2 puff twice daily.  Follow up in 7-10 days. Keep regular appointment with Dr. Posey Rea  Acute Bronchitis Bronchitis is inflammation of the airways that extend from the windpipe into the lungs (bronchi). The inflammation often causes mucus to develop. This leads to a cough, which is the most common symptom of bronchitis.  In acute bronchitis, the condition usually develops suddenly and goes away over time, usually in a couple weeks. Smoking, allergies, and asthma can make bronchitis worse. Repeated episodes of bronchitis may cause further lung problems.  CAUSES Acute bronchitis is most often caused by the same virus that causes a cold. The virus can spread from person to person (contagious) through coughing, sneezing, and touching contaminated objects. SIGNS AND SYMPTOMS   Cough.   Fever.   Coughing up mucus.   Body aches.   Chest congestion.   Chills.   Shortness of breath.   Sore throat.  DIAGNOSIS  Acute bronchitis is usually diagnosed through a physical exam. Your health care provider will also ask you questions about your medical history. Tests, such as chest X-rays, are sometimes done to rule out other conditions.  TREATMENT  Acute bronchitis usually goes away in a couple weeks. Oftentimes, no medical treatment is necessary. Medicines are sometimes given for relief of fever or cough. Antibiotic medicines are usually not needed but may be prescribed in certain situations. In some cases, an inhaler may be recommended to help reduce shortness of breath and control the cough. A cool mist vaporizer may also be used to help thin bronchial secretions and make it easier to clear the chest.  HOME CARE INSTRUCTIONS  Get plenty of rest.   Drink enough fluids to keep your urine clear or pale yellow (unless you have a medical condition that  requires fluid restriction). Increasing fluids may help thin your respiratory secretions (sputum) and reduce chest congestion, and it will prevent dehydration.   Take medicines only as directed by your health care provider.  If you were prescribed an antibiotic medicine, finish it all even if you start to feel better.  Avoid smoking and secondhand smoke. Exposure to cigarette smoke or irritating chemicals will make bronchitis worse. If you are a smoker, consider using nicotine gum or skin patches to help control withdrawal symptoms. Quitting smoking will help your lungs heal faster.   Reduce the chances of another bout of acute bronchitis by washing your hands frequently, avoiding people with cold symptoms, and trying not to touch your hands to your mouth, nose, or eyes.   Keep all follow-up visits as directed by your health care provider.  SEEK MEDICAL CARE IF: Your symptoms do not improve after 1 week of treatment.  SEEK IMMEDIATE MEDICAL CARE IF:  You develop an increased fever or chills.   You have chest pain.   You have severe shortness of breath.  You have bloody sputum.   You develop dehydration.  You faint or repeatedly feel like you are going to pass out.  You develop repeated vomiting.  You develop a severe headache. MAKE SURE YOU:   Understand these instructions.  Will watch your condition.  Will get help right away if you are not doing well or get worse. Document Released: 08/18/2004 Document Revised: 11/25/2013 Document Reviewed: 01/01/2013 [facility] Patient Information 2015 [place], [place]. This information is not intended to replace advice given to you by your health  care provider. Make sure you discuss any questions you have with your health care provider.  

## 2014-04-22 ENCOUNTER — Ambulatory Visit: Payer: Managed Care, Other (non HMO) | Admitting: Nurse Practitioner

## 2014-05-11 ENCOUNTER — Other Ambulatory Visit: Payer: Self-pay | Admitting: Nurse Practitioner

## 2014-08-30 ENCOUNTER — Encounter: Payer: Self-pay | Admitting: Internal Medicine

## 2014-09-17 ENCOUNTER — Encounter: Payer: Self-pay | Admitting: Internal Medicine

## 2014-10-01 ENCOUNTER — Ambulatory Visit (AMBULATORY_SURGERY_CENTER): Payer: Self-pay | Admitting: *Deleted

## 2014-10-01 VITALS — Ht 64.0 in | Wt 133.4 lb

## 2014-10-01 DIAGNOSIS — Z1211 Encounter for screening for malignant neoplasm of colon: Secondary | ICD-10-CM

## 2014-10-01 MED ORDER — MOVIPREP 100 G PO SOLR: ORAL | Status: DC

## 2014-10-01 NOTE — Progress Notes (Signed)
No allergies to eggs or soy. No problems with anesthesia.  Pt given Emmi instructions for colonoscopy  No oxygen use  No diet drug use  

## 2014-10-15 ENCOUNTER — Encounter: Payer: Managed Care, Other (non HMO) | Admitting: Internal Medicine

## 2014-10-24 ENCOUNTER — Encounter: Payer: Self-pay | Admitting: Internal Medicine

## 2014-10-24 ENCOUNTER — Ambulatory Visit (AMBULATORY_SURGERY_CENTER): Payer: Self-pay | Admitting: Internal Medicine

## 2014-10-24 VITALS — BP 98/79 | HR 64 | Temp 96.5°F | Resp 24 | Ht 64.0 in | Wt 133.0 lb

## 2014-10-24 DIAGNOSIS — Z1211 Encounter for screening for malignant neoplasm of colon: Secondary | ICD-10-CM

## 2014-10-24 MED ORDER — HYDROCORTISONE ACE-PRAMOXINE 1-1 % RE CREA: 1.0000 "application " | TOPICAL_CREAM | Freq: Two times a day (BID) | RECTAL | Status: DC

## 2014-10-24 MED ORDER — SODIUM CHLORIDE 0.9 % IV SOLN: 500.0000 mL | INTRAVENOUS | Status: DC

## 2014-10-24 NOTE — Op Note (Signed)
[facility] [address] [place], [id number]   COLONOSCOPY PROCEDURE REPORT  PATIENT: [name], [name]  MR#: [id number] BIRTHDATE: 14-Jul-1951 , 63  yrs. old GENDER: female ENDOSCOPIST: Hart Carwinora M Mariella Blackwelder, MD REFERRED NF:AOZHBY:Alex Buckner MaltaV Plotnikov, M.D. PROCEDURE DATE:  10/24/2014 PROCEDURE:   Colonoscopy, screening First Screening Colonoscopy - Avg.  risk and is 50 yrs.  old or older - No.  Prior Negative Screening - Now for repeat screening. 10 or more years since last screening  History of Adenoma - Now for follow-up colonoscopy & has been > or = to 3 yrs.  N/A ASA CLASS:   Class I INDICATIONS:Screening for colonic neoplasia and Colorectal Neoplasm Risk Assessment for this procedure is average risk. , sister with Crohn's disease MEDICATIONS: Monitored anesthesia care and Propofol 200 mg IV  DESCRIPTION OF PROCEDURE:   After the risks benefits and alternatives of the procedure were thoroughly explained, informed consent was obtained.  The digital rectal exam revealed no abnormalities of the rectum.   The LB [id number]-H190 [id number]  endoscope was introduced through the anus and advanced to the cecum, which was identified by both the appendix and ileocecal valve. No adverse events experienced.   The quality of the prep was excellent. (MoviPrep was used)  The instrument was then slowly withdrawn as the colon was fully examined.      COLON FINDINGS: A normal appearing cecum, ileocecal valve, and appendiceal orifice were identified.  The ascending, transverse, descending, sigmoid colon, and rectum appeared unremarkable. Retroflexed views revealed no abnormalities. The time to cecum = 4.46 Withdrawal time = 7.14   The scope was withdrawn and the procedure completed. COMPLICATIONS: There were no immediate complications.  ENDOSCOPIC IMPRESSION: Normal colonoscopy minimal diverticulosis of the sigmoid colon ( scattered diverticuli)  RECOMMENDATIONS: High-fiber diet Recall  colonoscopy in 10 years Analpram 2.5 % prn rectal irritation  eSigned:  Hart Carwinora M Lashena Signer, MD 10/24/2014 7:57 AM   cc:

## 2014-10-24 NOTE — Progress Notes (Signed)
Report to PACU, RN, vss, BBS= Clear.  

## 2014-10-24 NOTE — Patient Instructions (Signed)
YOU HAD AN ENDOSCOPIC PROCEDURE TODAY AT THE [facility]:   Refer to the procedure report that was given to you for any specific questions about what was found during the examination.  If the procedure report does not answer your questions, please call your gastroenterologist to clarify.  If you requested that your care partner not be given the details of your procedure findings, then the procedure report has been included in a sealed envelope for you to review at your convenience later.  YOU SHOULD EXPECT: Some feelings of bloating in the abdomen. Passage of more gas than usual.  Walking can help get rid of the air that was put into your GI tract during the procedure and reduce the bloating. If you had a lower endoscopy (such as a colonoscopy or flexible sigmoidoscopy) you may notice spotting of blood in your stool or on the toilet paper. If you underwent a bowel prep for your procedure, you may not have a normal bowel movement for a few days.  Please Note:  You might notice some irritation and congestion in your nose or some drainage.  This is from the oxygen used during your procedure.  There is no need for concern and it should clear up in a day or so.  SYMPTOMS TO REPORT IMMEDIATELY:   Following lower endoscopy (colonoscopy or flexible sigmoidoscopy):  Excessive amounts of blood in the stool  Significant tenderness or worsening of abdominal pains  Swelling of the abdomen that is new, acute  Fever of 100F or higher  For urgent or emergent issues, a gastroenterologist can be reached at any hour by calling [phone].  DIET: Your first meal following the procedure should be a small meal and then it is ok to progress to your normal diet. Heavy or fried foods are harder to digest and may make you feel nauseous or bloated.  Likewise, meals heavy in dairy and vegetables can increase bloating.  Drink plenty of fluids but you should avoid alcoholic beverages for 24 hours.  ACTIVITY:   You should plan to take it easy for the rest of today and you should NOT DRIVE or use heavy machinery until tomorrow (because of the sedation medicines used during the test).    FOLLOW UP: Our staff will call the number listed on your records the next business day following your procedure to check on you and address any questions or concerns that you may have regarding the information given to you following your procedure. If we do not reach you, we will leave a message.  However, if you are feeling well and you are not experiencing any problems, there is no need to return our call.  We will assume that you have returned to your regular daily activities without incident.  SIGNATURES/CONFIDENTIALITY: You and/or your care partner have signed paperwork which will be entered into your electronic medical record.  These signatures attest to the fact that that the information above on your After Visit Summary has been reviewed and is understood.  Full responsibility of the confidentiality of this discharge information lies with you and/or your care-partner.  Next colonoscopy in 10 years  Please read over handouts about diverticulosis and high fiber diets  Continue your normal medications

## 2014-10-27 ENCOUNTER — Telehealth: Payer: Self-pay

## 2014-10-27 NOTE — Telephone Encounter (Signed)
  Follow up Call-  Call back number 10/24/2014  Post procedure Call Back phone  # [phone]  Permission to leave phone message Yes     Patient questions:  Do you have a fever, pain , or abdominal swelling? No. Pain Score  0 *  Have you tolerated food without any problems? Yes.    Have you been able to return to your normal activities? Yes.    Do you have any questions about your discharge instructions: Diet   No. Medications  No. Follow up visit  No.  Do you have questions or concerns about your Care? No.  Actions: * If pain score is 4 or above: No action needed, pain <4.

## 2015-06-11 ENCOUNTER — Inpatient Hospital Stay
Admission: RE | Admit: 2015-06-11 | Discharge: 2015-06-11 | Disposition: A | Discharge status: Home / Self Care | Payer: Self-pay | Source: Ambulatory Visit | Attending: Orthopedic Surgery | Admitting: Orthopedic Surgery

## 2015-06-11 ENCOUNTER — Other Ambulatory Visit: Payer: Self-pay | Admitting: Orthopedic Surgery

## 2015-06-11 ENCOUNTER — Ambulatory Visit
Admission: RE | Admit: 2015-06-11 | Discharge: 2015-06-11 | Disposition: A | Discharge status: Home / Self Care | Payer: 59 | Source: Ambulatory Visit | Attending: Orthopedic Surgery | Admitting: Orthopedic Surgery

## 2015-06-11 DIAGNOSIS — R52 Pain, unspecified: Secondary | ICD-10-CM

## 2015-06-11 DIAGNOSIS — R079 Chest pain, unspecified: Secondary | ICD-10-CM

## 2015-06-11 MED ORDER — IOPAMIDOL (ISOVUE-370) INJECTION 76%
80.0000 mL | Freq: Once | INTRAVENOUS | Status: AC | PRN
  Administered 2015-06-11: 80 mL via INTRAVENOUS

## 2015-09-08 ENCOUNTER — Encounter: Payer: Self-pay | Admitting: Internal Medicine

## 2015-09-08 ENCOUNTER — Ambulatory Visit (INDEPENDENT_AMBULATORY_CARE_PROVIDER_SITE_OTHER): Payer: 59 | Admitting: Internal Medicine

## 2015-09-08 ENCOUNTER — Telehealth: Payer: Self-pay | Admitting: Internal Medicine

## 2015-09-08 VITALS — BP 100/68 | HR 92 | Temp 99.0°F | Resp 18 | Ht 64.0 in | Wt 134.0 lb

## 2015-09-08 DIAGNOSIS — R059 Cough, unspecified: Secondary | ICD-10-CM

## 2015-09-08 DIAGNOSIS — R05 Cough: Secondary | ICD-10-CM | POA: Diagnosis not present

## 2015-09-08 MED ORDER — BENZONATATE 200 MG PO CAPS: 200.0000 mg | ORAL_CAPSULE | Freq: Three times a day (TID) | ORAL | Status: DC | PRN

## 2015-09-08 MED ORDER — HYDROCODONE-HOMATROPINE 5-1.5 MG/5ML PO SYRP
5.0000 mL | ORAL_SOLUTION | Freq: Three times a day (TID) | ORAL | Status: DC | PRN
Start: 2015-09-08 — End: 2016-09-28

## 2015-09-08 NOTE — Assessment & Plan Note (Signed)
At this time appears to be viral symptoms. Rx for tessalon perles and hycodan for her cough and given instructions on OTC remedies. Talked to her about typical course and length. Not likely flu and outside window for tamiflu.

## 2015-09-08 NOTE — Progress Notes (Signed)
Pre visit review using our clinic review tool, if applicable. No additional management support is needed unless otherwise documented below in the visit note. 

## 2015-09-08 NOTE — Telephone Encounter (Signed)
Patient Name: [name]  DOB: 1951/04/21    Initial Comment Caller states thinks may have pneumonia, history; Sunday had nausea; yesterday fine; today has severe cough w/mucus; slightly elevated temp 98.6;    Nurse Assessment  Nurse: Scarlette Ar, RN, Heather Date/Time (Eastern Time): 09/08/2015 9:09:36 AM  Confirm and document reason for call. If symptomatic, describe symptoms. You must click the next button to save text entered. ---Caller states thinks may have pneumonia, history; Sunday had nausea; yesterday fine; today has severe cough w/mucus; slightly elevated temp 98.6; This started Sunday  Has the patient traveled out of the country within the last 30 days? ---Not Applicable  Does the patient have any new or worsening symptoms? ---Yes  Will a triage be completed? ---Yes  Related visit to physician within the last 2 weeks? ---No  Does the PT have any chronic conditions? (i.e. diabetes, asthma, etc.) ---Yes  List chronic conditions. ---See. MR  Is this a behavioral health or substance abuse call? ---No     Guidelines    Guideline Title Affirmed Question Affirmed Notes  Cough - Acute Productive SEVERE coughing spells (e.g., whooping sound after coughing, vomiting after coughing)    Final Disposition User   See Physician within 24 Hours Standifer, RN, Research scientist (physical sciences)    Comments  Appt at 1:15 with Dr. Okey Dupre.   Referrals  REFERRED TO PCP OFFICE   Disagree/Comply: Comply

## 2015-09-08 NOTE — Patient Instructions (Signed)
We have sent in tessalon perles for the cough which you can take during the day up to 3 times per day.   We have also given you a prescription for the cough syrup to help with the cough and it could make you sleepy.  Upper Respiratory Infection, Adult Most upper respiratory infections (URIs) are a viral infection of the air passages leading to the lungs. A URI affects the nose, throat, and upper air passages. The most common type of URI is nasopharyngitis and is typically referred to as "the common cold." URIs run their course and usually go away on their own. Most of the time, a URI does not require medical attention, but sometimes a bacterial infection in the upper airways can follow a viral infection. This is called a secondary infection. Sinus and middle ear infections are common types of secondary upper respiratory infections. Bacterial pneumonia can also complicate a URI. A URI can worsen asthma and chronic obstructive pulmonary disease (COPD). Sometimes, these complications can require emergency medical care and may be life threatening.  CAUSES Almost all URIs are caused by viruses. A virus is a type of germ and can spread from one person to another.  RISKS FACTORS You may be at risk for a URI if:   You smoke.   You have chronic heart or lung disease.  You have a weakened defense (immune) system.   You are very young or very old.   You have nasal allergies or asthma.  You work in crowded or poorly ventilated areas.  You work in health care facilities or schools. SIGNS AND SYMPTOMS  Symptoms typically develop 2-3 days after you come in contact with a cold virus. Most viral URIs last 7-10 days. However, viral URIs from the influenza virus (flu virus) can last 14-18 days and are typically more severe. Symptoms may include:   Runny or stuffy (congested) nose.   Sneezing.   Cough.   Sore throat.   Headache.   Fatigue.   Fever.   Loss of appetite.   Pain in  your forehead, behind your eyes, and over your cheekbones (sinus pain).  Muscle aches.  DIAGNOSIS  Your health care provider may diagnose a URI by:  Physical exam.  Tests to check that your symptoms are not due to another condition such as:  Strep throat.  Sinusitis.  Pneumonia.  Asthma. TREATMENT  A URI goes away on its own with time. It cannot be cured with medicines, but medicines may be prescribed or recommended to relieve symptoms. Medicines may help:  Reduce your fever.  Reduce your cough.  Relieve nasal congestion. HOME CARE INSTRUCTIONS   Take medicines only as directed by your health care provider.   Gargle warm saltwater or take cough drops to comfort your throat as directed by your health care provider.  Use a warm mist humidifier or inhale steam from a shower to increase air moisture. This may make it easier to breathe.  Drink enough fluid to keep your urine clear or pale yellow.   Eat soups and other clear broths and maintain good nutrition.   Rest as needed.   Return to work when your temperature has returned to normal or as your health care provider advises. You may need to stay home longer to avoid infecting others. You can also use a face mask and careful hand washing to prevent spread of the virus.  Increase the usage of your inhaler if you have asthma.   Do not use any  tobacco products, including cigarettes, chewing tobacco, or electronic cigarettes. If you need help quitting, ask your health care provider. PREVENTION  The best way to protect yourself from getting a cold is to practice good hygiene.   Avoid oral or hand contact with people with cold symptoms.   Wash your hands often if contact occurs.  There is no clear evidence that vitamin C, vitamin E, echinacea, or exercise reduces the chance of developing a cold. However, it is always recommended to get plenty of rest, exercise, and practice good nutrition.  SEEK MEDICAL CARE IF:    You are getting worse rather than better.   Your symptoms are not controlled by medicine.   You have chills.  You have worsening shortness of breath.  You have brown or red mucus.  You have yellow or brown nasal discharge.  You have pain in your face, especially when you bend forward.  You have a fever.  You have swollen neck glands.  You have pain while swallowing.  You have white areas in the back of your throat. SEEK IMMEDIATE MEDICAL CARE IF:   You have severe or persistent:  Headache.  Ear pain.  Sinus pain.  Chest pain.  You have chronic lung disease and any of the following:  Wheezing.  Prolonged cough.  Coughing up blood.  A change in your usual mucus.  You have a stiff neck.  You have changes in your:  Vision.  Hearing.  Thinking.  Mood. MAKE SURE YOU:   Understand these instructions.  Will watch your condition.  Will get help right away if you are not doing well or get worse.   This information is not intended to replace advice given to you by your health care provider. Make sure you discuss any questions you have with your health care provider.   Document Released: 01/04/2001 Document Revised: 11/25/2014 Document Reviewed: 10/16/2013 Elsevier Interactive Patient Education [employer]

## 2015-09-08 NOTE — Progress Notes (Signed)
   Subjective:    Patient ID: [name], female    DOB: 09-27-1950, 65 y.o.   MRN: [id number]  HPI The patient is a 65 YO female coming in acutely for cough. She has noticed it for about 3-4 days. No fevers or chills. No SOB. Non-productive cough. Lots of nasal drainage, no sinus tenderness. Mild sore throat. Taking cold medicine over the counter which is helping a little. Her husband was sick about 2 weeks ago and they traveled via plane this last weekend.   Review of Systems  Constitutional: Negative for fever, chills, activity change, appetite change, fatigue and unexpected weight change.  HENT: Positive for congestion, rhinorrhea and sore throat. Negative for drooling, ear discharge, ear pain, postnasal drip, sinus pressure and trouble swallowing.   Eyes: Negative.   Respiratory: Positive for cough. Negative for chest tightness, shortness of breath and wheezing.   Cardiovascular: Negative for chest pain, palpitations and leg swelling.  Gastrointestinal: Negative.   Musculoskeletal: Negative.   Neurological: Positive for headaches.      Objective:   Physical Exam  Constitutional: She is oriented to person, place, and time. She appears well-developed and well-nourished.  HENT:  Head: Normocephalic and atraumatic.  Right Ear: External ear normal.  Left Ear: External ear normal.  Oropharynx with redness and clear drainage.   Eyes: EOM are normal.  Neck: Normal range of motion.  Cardiovascular: Normal rate and regular rhythm.   Pulmonary/Chest: Effort normal and breath sounds normal. No respiratory distress. She has no wheezes. She has no rales.  Abdominal: Soft.  Lymphadenopathy:    She has no cervical adenopathy.  Neurological: She is alert and oriented to person, place, and time.  Skin: Skin is warm and dry.   Filed Vitals:   09/08/15 1321  BP: 100/68  Pulse: 92  Temp: 99 F (37.2 C)  TempSrc: Oral  Resp: 18  Height:  (1.626 m)  Weight: 134 lb (60.782 kg)    SpO2: 96%      Assessment & Plan:

## 2015-09-18 ENCOUNTER — Telehealth: Payer: Self-pay | Admitting: Internal Medicine

## 2015-09-18 NOTE — Telephone Encounter (Signed)
Notified patient.

## 2015-09-18 NOTE — Telephone Encounter (Signed)
Would recommend visit to urgent care.

## 2015-09-18 NOTE — Telephone Encounter (Signed)
Patient has called back in regards.  She is requesting medication before close today.

## 2015-09-18 NOTE — Telephone Encounter (Signed)
Pt was in a couple weeks ago and her cough is deep down in her chest now. She feels she may have a secondary infection. She is out of town and she needs to know if you can call her something in or if she should visit an urgent care. Pharmacy is [facility] [address], [place] [phone]  Best number is [phone]

## 2015-09-22 LAB — HM MAMMOGRAPHY

## 2015-09-24 LAB — HM MAMMOGRAPHY

## 2015-09-29 ENCOUNTER — Encounter: Payer: Self-pay | Admitting: Internal Medicine

## 2015-10-13 ENCOUNTER — Other Ambulatory Visit: Payer: Self-pay | Admitting: Nurse Practitioner

## 2015-10-13 DIAGNOSIS — R059 Cough, unspecified: Secondary | ICD-10-CM

## 2015-10-13 DIAGNOSIS — R05 Cough: Secondary | ICD-10-CM

## 2015-10-13 MED ORDER — BECLOMETHASONE DIPROPIONATE 80 MCG/ACT IN AERS: 2.0000 | INHALATION_SPRAY | Freq: Two times a day (BID) | RESPIRATORY_TRACT | Status: AC

## 2015-10-13 NOTE — Addendum Note (Signed)
Addended by: Deatra JamesBRAND, Carlon Chaloux M on: 10/13/2015 03:39 PM   Modules accepted: Orders

## 2015-11-24 ENCOUNTER — Encounter: Payer: Self-pay | Admitting: Internal Medicine

## 2016-01-06 ENCOUNTER — Other Ambulatory Visit: Payer: Self-pay | Admitting: Obstetrics and Gynecology

## 2016-01-07 LAB — CYTOLOGY - PAP

## 2016-01-22 ENCOUNTER — Encounter: Payer: Self-pay | Admitting: Internal Medicine

## 2016-05-22 ENCOUNTER — Encounter (HOSPITAL_COMMUNITY): Payer: Self-pay | Admitting: Emergency Medicine

## 2016-05-22 ENCOUNTER — Ambulatory Visit (HOSPITAL_COMMUNITY)
Admission: EM | Admit: 2016-05-22 | Discharge: 2016-05-22 | Disposition: A | Discharge status: Home / Self Care | Payer: 59 | Attending: Emergency Medicine | Admitting: Emergency Medicine

## 2016-05-22 DIAGNOSIS — N390 Urinary tract infection, site not specified: Secondary | ICD-10-CM

## 2016-05-22 DIAGNOSIS — R350 Frequency of micturition: Secondary | ICD-10-CM | POA: Diagnosis present

## 2016-05-22 LAB — POCT URINALYSIS DIP (DEVICE)
BILIRUBIN URINE: NEGATIVE
GLUCOSE, UA: NEGATIVE mg/dL
KETONES UR: NEGATIVE mg/dL
NITRITE: NEGATIVE
PH: 5.5 (ref 5.0–8.0)
PROTEIN: NEGATIVE mg/dL
Specific Gravity, Urine: <=1.005 (ref 1.005–1.030)
Urobilinogen, UA: 0.2 mg/dL (ref 0.0–1.0)

## 2016-05-22 MED ORDER — CEPHALEXIN 500 MG PO CAPS: 500.0000 mg | ORAL_CAPSULE | Freq: Four times a day (QID) | ORAL | 0 refills | Status: DC

## 2016-05-22 NOTE — ED Provider Notes (Signed)
CSN: [id number]     Arrival date & time 05/22/16  1934 History   First MD Initiated Contact with Patient 05/22/16 2033     Chief Complaint  Patient presents with  . Urinary Frequency   (Consider location/radiation/quality/duration/timing/severity/associated sxs/prior Treatment) 65 year old female complaining of urinary frequency, urinary urgency and pressure over the bladder for the past couple days. Denies nausea, vomiting, abdominal pain or fever. Does not have a history of frequent UTIs.      Past Medical History:  Diagnosis Date  . Allergy   . Duodenitis 2006  . GERD (gastroesophageal reflux disease)   . IBS (irritable bowel syndrome)   . Osteoporosis    Past Surgical History:  Procedure Laterality Date  . ANTERIOR FUSION CERVICAL SPINE  04/2007  . CESAREAN SECTION     x 3  . CHOLECYSTECTOMY  03/2006  . FACIAL COSMETIC SURGERY  2007  . FLEXOR TENDON OF FOREARM / WRIST REPAIR  '05   Dr. Teressa SenterSypher for tennis injury-left forearm  . pelvic bone cyst resection  1998  . TUBAL LIGATION  1988   Family History  Problem Relation Age of Onset  . Crohn's disease Sister   . Irritable bowel syndrome Sister   . Heart disease Sister     had rheumatic fever  . Hypertension Sister   . Heart disease Mother     SSS with PTVDP  . Arthritis Mother   . Hypertension Father   . Alzheimer's disease Father   . Stroke Maternal Grandmother   . Breast cancer Maternal Grandmother   . Breast cancer Maternal Aunt   . Colon cancer Neg Hx   . Diabetes Neg Hx    Social History  Substance Use Topics  . Smoking status: Never Smoker  . Smokeless tobacco: Never Used  . Alcohol use 1.8 oz/week    3 Glasses of wine per week   OB History    No data available     Review of Systems  Constitutional: Negative.   HENT: Negative.   Respiratory: Negative.   Gastrointestinal: Negative.   Genitourinary: Positive for frequency and urgency. Negative for dysuria, vaginal bleeding and vaginal discharge.   Musculoskeletal: Negative.   Neurological: Negative.   All other systems reviewed and are negative.   Allergies  Review of patient's allergies indicates no known allergies.  Home Medications   Prior to Admission medications   Medication Sig Start Date End Date Taking? Authorizing Provider  beclomethasone (QVAR) 80 MCG/ACT inhaler Inhale 2 puffs into the lungs 2 (two) times daily. 10/13/15   Georgina QuintAleksei V Plotnikov, MD  benzonatate (TESSALON) 200 MG capsule Take 1 capsule (200 mg total) by mouth 3 (three) times daily as needed for cough. 09/08/15   Myrlene BrokerElizabeth A Crawford, MD  cephALEXin (KEFLEX) 500 MG capsule Take 1 capsule (500 mg total) by mouth 4 (four) times daily. 05/22/16   Hayden Rasmussenavid Darsi Tien, NP  Cholecalciferol (VITAMIN D PO) Take by mouth daily.    Historical Provider, MD  Cyanocobalamin (VITAMIN B-12) 1000 MCG SUBL Place 1 tablet (1,000 mcg total) under the tongue daily. 11/07/12   Georgina QuintAleksei V Plotnikov, MD  HYDROcodone-homatropine (HYCODAN) 5-1.5 MG/5ML syrup Take 5 mLs by mouth every 8 (eight) hours as needed for cough. 09/08/15   Myrlene BrokerElizabeth A Crawford, MD  ibuprofen (ADVIL,MOTRIN) 200 MG tablet Take 200 mg by mouth every 6 (six) hours as needed.    Historical Provider, MD  pramoxine-hydrocortisone (PROCTOCREAM-HC) 1-1 % rectal cream Place 1 application rectally 2 (two) times daily. Patient not  taking: Reported on 05/22/2016 10/24/14   Hart Carwinora M Brodie, MD  triamcinolone cream (KENALOG) 0.5 % Apply topically 3 (three) times daily. Patient not taking: Reported on 05/22/2016 11/16/12   Tresa GarterAleksei V Plotnikov, MD   Meds Ordered and Administered this Visit  Medications - No data to display  BP 113/62 (BP Location: Left Arm)   Pulse 73   Temp 98.5 F (36.9 C) (Oral)   Resp 18   SpO2 99%  No data found.   Physical Exam  Constitutional: She is oriented to person, place, and time. She appears well-developed and well-nourished. No distress.  Eyes: EOM are normal.  Neck: Neck supple.  Cardiovascular:  Normal rate.   Pulmonary/Chest: Effort normal. No respiratory distress.  Musculoskeletal: She exhibits no edema.  Neurological: She is alert and oriented to person, place, and time. She exhibits normal muscle tone.  Skin: Skin is warm and dry.  Psychiatric: She has a normal mood and affect.  Nursing note and vitals reviewed.   Urgent Care Course   Clinical Course    Procedures (including critical care time)  Labs Review Labs Reviewed  POCT URINALYSIS DIP (DEVICE) - Abnormal; Notable for the following:       Result Value   Hgb urine dipstick TRACE (*)    Leukocytes, UA SMALL (*)    All other components within normal limits  URINE CULTURE    Imaging Review No results found.   Visual Acuity Review  Right Eye Distance:   Left Eye Distance:   Bilateral Distance:    Right Eye Near:   Left Eye Near:    Bilateral Near:         MDM   1. Lower urinary tract infectious disease    Drink plenty of fluids. He may take Azo-Standard which contains phenazopyridine for urinary symptoms. It is a small burgundy peeled can turn her urine reddish discharge. It helps relieve urgency, pain with urination and discomfort. Take the antibiotic as prescribed. A culture of your urine will be performed and if it grows out a particular bacteria that is not susceptible to the Keflex we may call you and changed prescription over the phone. Meds ordered this encounter  Medications  . cephALEXin (KEFLEX) 500 MG capsule    Sig: Take 1 capsule (500 mg total) by mouth 4 (four) times daily.    Dispense:  28 capsule    Refill:  0    Order Specific Question:   Supervising Provider    Answer:   Domenick GongMORTENSON, ASHLEY [[id number]]       Hayden Rasmussenavid Christphor Groft, NP 05/22/16 2042

## 2016-05-22 NOTE — ED Triage Notes (Signed)
[facility] with a complaint of urinary frequency and some mild urinary pressure that started yesterday.

## 2016-05-22 NOTE — Discharge Instructions (Signed)
Drink plenty of fluids. He may take Azo-Standard which contains phenazopyridine for urinary symptoms. It is a small burgundy peeled can turn her urine reddish discharge. It helps relieve urgency, pain with urination and discomfort. Take the antibiotic as prescribed. A culture of your urine will be performed and if it grows out a particular bacteria that is not susceptible to the Keflex we may call you and changed prescription over the phone.

## 2016-05-24 LAB — URINE CULTURE

## 2016-09-28 ENCOUNTER — Ambulatory Visit (HOSPITAL_COMMUNITY)
Admission: EM | Admit: 2016-09-28 | Discharge: 2016-09-28 | Disposition: A | Discharge status: Home / Self Care | Payer: 59 | Attending: Family Medicine | Admitting: Family Medicine

## 2016-09-28 ENCOUNTER — Encounter (HOSPITAL_COMMUNITY): Payer: Self-pay | Admitting: Family Medicine

## 2016-09-28 DIAGNOSIS — J40 Bronchitis, not specified as acute or chronic: Secondary | ICD-10-CM | POA: Diagnosis not present

## 2016-09-28 MED ORDER — AMOXICILLIN 875 MG PO TABS: 875.0000 mg | ORAL_TABLET | Freq: Two times a day (BID) | ORAL | 0 refills | Status: DC

## 2016-09-28 NOTE — ED Triage Notes (Signed)
Pt  Reports  A  persistant  Cough   With   Sinus drainage       Symptoms  For  Over  1   Week      Sitting  Upright  On  Exam table  Speaking in  Complete  sentances

## 2016-09-28 NOTE — ED Provider Notes (Signed)
[facility]    CSN: [id number] Arrival date & time: 09/28/16  1004     History   Chief Complaint Chief Complaint  Patient presents with  . Cough    HPI [name] is a 66 y.o. female.   This is a 66 year old woman who presents to the urgent care clinic with symptoms of an upper respiratory infection. She's had fullness in her ears and cough. She is able to sleep through the night with Robitussin. She's been taking Mucinex as well.  She's had the symptoms for 2 weeks.      Past Medical History:  Diagnosis Date  . Allergy   . Duodenitis 2006  . GERD (gastroesophageal reflux disease)   . IBS (irritable bowel syndrome)   . Osteoporosis     Patient Active Problem List   Diagnosis Date Noted  . Other abnormal glucose 06/19/2013  . Cough 06/19/2013  . Sunburn 11/16/2012  . Unspecified vitamin D deficiency 11/08/2012  . Dysuria 11/07/2012  . Stress 11/07/2012  . Other malaise and fatigue 11/07/2012  . Adjustment disorder with mixed anxiety and depressed mood 11/07/2012  . B12 deficiency 11/07/2012  . Routine health maintenance 05/14/2011  . IBS (irritable bowel syndrome)   . GERD (gastroesophageal reflux disease)   . Pulmonary nodules 04/01/2011    Past Surgical History:  Procedure Laterality Date  . ANTERIOR FUSION CERVICAL SPINE  04/2007  . CESAREAN SECTION     x 3  . CHOLECYSTECTOMY  03/2006  . FACIAL COSMETIC SURGERY  2007  . FLEXOR TENDON OF FOREARM / WRIST REPAIR  '05   Dr. Teressa Senter for tennis injury-left forearm  . pelvic bone cyst resection  1998  . TUBAL LIGATION  1988    OB History    No data available       Home Medications    Prior to Admission medications   Medication Sig Start Date End Date Taking? Authorizing Provider  amoxicillin (AMOXIL) 875 MG tablet Take 1 tablet (875 mg total) by mouth 2 (two) times daily. 09/28/16   Elvina Sidle, MD  beclomethasone (QVAR) 80 MCG/ACT inhaler Inhale 2 puffs into the lungs 2 (two)  times daily. 10/13/15   Aleksei Plotnikov V, MD  Cholecalciferol (VITAMIN D PO) Take by mouth daily.    Historical Provider, MD  Cyanocobalamin (VITAMIN B-12) 1000 MCG SUBL Place 1 tablet (1,000 mcg total) under the tongue daily. 11/07/12   Aleksei Plotnikov V, MD  ibuprofen (ADVIL,MOTRIN) 200 MG tablet Take 200 mg by mouth every 6 (six) hours as needed.    Historical Provider, MD    Family History Family History  Problem Relation Age of Onset  . Crohn's disease Sister   . Irritable bowel syndrome Sister   . Heart disease Sister     had rheumatic fever  . Hypertension Sister   . Heart disease Mother     SSS with PTVDP  . Arthritis Mother   . Hypertension Father   . Alzheimer's disease Father   . Stroke Maternal Grandmother   . Breast cancer Maternal Grandmother   . Breast cancer Maternal Aunt   . Colon cancer Neg Hx   . Diabetes Neg Hx     Social History Social History  Substance Use Topics  . Smoking status: Never Smoker  . Smokeless tobacco: Never Used  . Alcohol use 1.8 oz/week    3 Glasses of wine per week     Allergies   Patient has no known allergies.   Review  of Systems Review of Systems  Constitutional: Negative.   HENT: Positive for ear pain.   Respiratory: Positive for cough.   Gastrointestinal: Negative.   Neurological: Negative.      Physical Exam Triage Vital Signs ED Triage Vitals  Enc Vitals Group     BP      Pulse      Resp      Temp      Temp src      SpO2      Weight      Height      Head Circumference      Peak Flow      Pain Score      Pain Loc      Pain Edu?      Excl. in GC?    No data found.   Updated Vital Signs BP 130/90 (BP Location: Right Arm)   Pulse 78   Temp 98.6 F (37 C) (Oral)   Resp 18   SpO2 98%    Physical Exam  Constitutional: She is oriented to person, place, and time. She appears well-developed and well-nourished.  HENT:  Right Ear: External ear normal.  Left Ear: External ear normal.    Mouth/Throat: Oropharynx is clear and moist.  Eyes: Conjunctivae are normal. Pupils are equal, round, and reactive to light.  Neck: Normal range of motion. Neck supple.  Pulmonary/Chest: Effort normal. She has rales.  Few rales at the left base  Musculoskeletal: Normal range of motion.  Neurological: She is alert and oriented to person, place, and time.  Skin: Skin is warm and dry.  Nursing note and vitals reviewed.    UC Treatments / Results  Labs (all labs ordered are listed, but only abnormal results are displayed) Labs Reviewed - No data to display  EKG  EKG Interpretation None       Radiology No results found.  Procedures Procedures (including critical care time)  Medications Ordered in UC Medications - No data to display   Initial Impression / Assessment and Plan / UC Course  I have reviewed the triage vital signs and the nursing notes.  Pertinent labs & imaging results that were available during my care of the patient were reviewed by me and considered in my medical decision making (see chart for details).     Final Clinical Impressions(s) / UC Diagnoses   Final diagnoses:  Bronchitis    New Prescriptions New Prescriptions   AMOXICILLIN (AMOXIL) 875 MG TABLET    Take 1 tablet (875 mg total) by mouth 2 (two) times daily.     Elvina SidleKurt Amirah Goerke, MD 09/28/16 1055

## 2016-11-29 ENCOUNTER — Other Ambulatory Visit: Payer: Self-pay | Admitting: Internal Medicine

## 2016-11-29 DIAGNOSIS — R05 Cough: Secondary | ICD-10-CM

## 2016-11-29 DIAGNOSIS — R059 Cough, unspecified: Secondary | ICD-10-CM

## 2019-08-07 ENCOUNTER — Ambulatory Visit: Payer: 59 | Attending: Internal Medicine

## 2019-08-07 DIAGNOSIS — Z20822 Contact with and (suspected) exposure to covid-19: Secondary | ICD-10-CM

## 2019-08-08 LAB — NOVEL CORONAVIRUS, NAA: SARS-CoV-2, NAA: NOT DETECTED

## 2019-08-24 ENCOUNTER — Ambulatory Visit: Payer: 59

## 2019-08-29 ENCOUNTER — Ambulatory Visit: Payer: 59

## 2019-09-01 ENCOUNTER — Ambulatory Visit: Payer: 59

## 2020-03-02 ENCOUNTER — Telehealth: Payer: Self-pay | Admitting: General Practice

## 2020-03-02 NOTE — Telephone Encounter (Signed)
New message:  Pt is calling to see if she can re-established with you. You haven't seen her in a long time. Please advise on your decision. Thanks

## 2020-03-03 NOTE — Telephone Encounter (Signed)
Yes. Thanks,

## 2020-03-03 NOTE — Telephone Encounter (Signed)
New message:   LVM for pt to call office back to schedule a NP appt with Dr. Macario Golds.

## 2020-09-23 ENCOUNTER — Ambulatory Visit: Payer: 59 | Admitting: Orthopaedic Surgery

## 2020-09-30 ENCOUNTER — Ambulatory Visit: Payer: Self-pay

## 2020-09-30 ENCOUNTER — Ambulatory Visit (INDEPENDENT_AMBULATORY_CARE_PROVIDER_SITE_OTHER): Payer: 59 | Admitting: Orthopaedic Surgery

## 2020-09-30 DIAGNOSIS — M25561 Pain in right knee: Secondary | ICD-10-CM | POA: Diagnosis not present

## 2020-09-30 NOTE — Progress Notes (Signed)
Office Visit Note   Patient: [name]           Date of Birth: 11/21/1950           MRN: [id number] Visit Date: 09/30/2020              Requested by: No referring provider defined for this encounter. PCP: Patient, No Pcp Per   Assessment & Plan: Visit Diagnoses:  1. Acute pain of right knee     Plan: I gave her reassurance that she does have a good knee in terms of still well-maintained joint space.  I think the worst case scenario for her would be a steroid injection in particular hyaluronic acid if she was still having knee issues.  She will still avoid high impact aerobic activities but can get back to tennis and Pilates.  I recommended Voltaren gel to try as well.  All questions and concerns were answered and addressed.  Follow-up can be as needed.  Follow-Up Instructions: Return if symptoms worsen or fail to improve.   Orders:  Orders Placed This Encounter  Procedures  . XR Knee 1-2 Views Right   No orders of the defined types were placed in this encounter.     Procedures: No procedures performed   Clinical Data: No additional findings.   Subjective: Chief Complaint  Patient presents with  . Right Knee - Pain  The patient is very active 70 year old female has been dealing with some knee pain for several months now.  It started after being on the floor with her grandchildren for a while.  It was on the medial aspect of the knee and it was hurting her going up and down stairs as well.  She is very active and does play tennis.  She also does Pilates but is held off from that since January.  She is never injured the knee before.  She denies any significant swelling.  She does like to exercise walk but keeps it on flat surfaces.  She has never had surgery on her right knee.  HPI  Review of Systems She currently denies any headache, chest pain, shortness of breath, fever, chills, nausea, vomiting  Objective: Vital Signs: There were no vitals taken for this  visit.  Physical Exam She is alert and orient x3 and in no acute distress Ortho Exam Examination of her right knee shows a negative Murray's and negative Lachman's exam with no significant effusion and excellent range of motion.  There is some medial joint line tenderness along the medial femoral condyle and the medial joint line area. Specialty Comments:  No specialty comments available.  Imaging: XR Knee 1-2 Views Right  Result Date: 09/30/2020 2 views of the right knee show no acute findings.  The joint space is well-maintained.    PMFS History: Patient Active Problem List   Diagnosis Date Noted  . Other abnormal glucose 06/19/2013  . Cough 06/19/2013  . Sunburn 11/16/2012  . Unspecified vitamin D deficiency 11/08/2012  . Dysuria 11/07/2012  . Stress 11/07/2012  . Other malaise and fatigue 11/07/2012  . Adjustment disorder with mixed anxiety and depressed mood 11/07/2012  . B12 deficiency 11/07/2012  . Routine health maintenance 05/14/2011  . IBS (irritable bowel syndrome)   . GERD (gastroesophageal reflux disease)   . Pulmonary nodules 04/01/2011   Past Medical History:  Diagnosis Date  . Allergy   . Duodenitis 2006  . GERD (gastroesophageal reflux disease)   . IBS (irritable bowel syndrome)   .  Osteoporosis     Family History  Problem Relation Age of Onset  . Crohn's disease Sister   . Irritable bowel syndrome Sister   . Heart disease Sister        had rheumatic fever  . Hypertension Sister   . Heart disease Mother        SSS with PTVDP  . Arthritis Mother   . Hypertension Father   . Alzheimer's disease Father   . Stroke Maternal Grandmother   . Breast cancer Maternal Grandmother   . Breast cancer Maternal Aunt   . Colon cancer Neg Hx   . Diabetes Neg Hx     Past Surgical History:  Procedure Laterality Date  . ANTERIOR FUSION CERVICAL SPINE  04/2007  . CESAREAN SECTION     x 3  . CHOLECYSTECTOMY  03/2006  . FACIAL COSMETIC SURGERY  2007  . FLEXOR  TENDON OF FOREARM / WRIST REPAIR  '05   Dr. Teressa Carlson for tennis injury-left forearm  . pelvic bone cyst resection  1998  . TUBAL LIGATION  1988   Social History   Occupational History  . Occupation: homemaker  Tobacco Use  . Smoking status: Never Smoker  . Smokeless tobacco: Never Used  Substance and Sexual Activity  . Alcohol use: Yes    Alcohol/week: 3.0 standard drinks    Types: 3 Glasses of wine per week  . Drug use: No  . Sexual activity: Yes    Partners: Male

## 2021-05-25 ENCOUNTER — Ambulatory Visit
Admission: RE | Admit: 2021-05-25 | Discharge: 2021-05-25 | Disposition: A | Discharge status: Home / Self Care | Payer: 59 | Source: Ambulatory Visit | Attending: Internal Medicine | Admitting: Internal Medicine

## 2021-05-25 ENCOUNTER — Other Ambulatory Visit: Payer: Self-pay | Admitting: Internal Medicine

## 2021-05-25 DIAGNOSIS — R071 Chest pain on breathing: Secondary | ICD-10-CM

## 2021-06-30 ENCOUNTER — Ambulatory Visit
Admission: RE | Admit: 2021-06-30 | Discharge: 2021-06-30 | Disposition: A | Discharge status: Home / Self Care | Payer: 59 | Source: Ambulatory Visit | Attending: Internal Medicine | Admitting: Internal Medicine

## 2021-06-30 ENCOUNTER — Other Ambulatory Visit: Payer: Self-pay | Admitting: Internal Medicine

## 2021-06-30 DIAGNOSIS — J0191 Acute recurrent sinusitis, unspecified: Secondary | ICD-10-CM

## 2022-04-26 ENCOUNTER — Other Ambulatory Visit: Payer: Self-pay | Admitting: Internal Medicine

## 2022-04-26 ENCOUNTER — Ambulatory Visit
Admission: RE | Admit: 2022-04-26 | Discharge: 2022-04-26 | Disposition: A | Discharge status: Home / Self Care | Payer: 59 | Source: Ambulatory Visit | Attending: Internal Medicine | Admitting: Internal Medicine

## 2022-04-26 DIAGNOSIS — M549 Dorsalgia, unspecified: Secondary | ICD-10-CM

## 2022-04-26 DIAGNOSIS — M810 Age-related osteoporosis without current pathological fracture: Secondary | ICD-10-CM

## 2022-05-11 ENCOUNTER — Encounter: Payer: Self-pay | Admitting: Physical Therapy

## 2022-05-11 ENCOUNTER — Ambulatory Visit: Payer: 59 | Attending: Internal Medicine | Admitting: Physical Therapy

## 2022-05-11 DIAGNOSIS — R293 Abnormal posture: Secondary | ICD-10-CM | POA: Insufficient documentation

## 2022-05-11 DIAGNOSIS — M810 Age-related osteoporosis without current pathological fracture: Secondary | ICD-10-CM | POA: Diagnosis present

## 2022-05-11 NOTE — Therapy (Signed)
OUTPATIENT PHYSICAL THERAPY THORACOLUMBAR EVALUATION and 1 x visit/DISCHARGE   Patient Name: [name] MRN: [id number] DOB:1951/02/22, 71 y.o., female Today's Date: 05/11/2022   PT End of Session - 05/11/22 0938     Visit Number 1    Number of Visits 1    Authorization Type [place]    PT Start Time 0930    PT Stop Time 1015    PT Time Calculation (min) 45 min    Activity Tolerance Patient tolerated treatment well    Behavior During Therapy [facility] for tasks assessed/performed             Past Medical History:  Diagnosis Date   Allergy    Duodenitis 2006   GERD (gastroesophageal reflux disease)    IBS (irritable bowel syndrome)    Osteoporosis    Past Surgical History:  Procedure Laterality Date   ANTERIOR FUSION CERVICAL SPINE  04/2007   CESAREAN SECTION     x 3   CHOLECYSTECTOMY  03/2006   FACIAL COSMETIC SURGERY  2007   FLEXOR TENDON OF FOREARM / WRIST REPAIR  '05   Dr. Daylene Katayama for tennis injury-left forearm   pelvic bone cyst resection  [place]   Patient Active Problem List   Diagnosis Date Noted   Other abnormal glucose 06/19/2013   Cough 06/19/2013   Sunburn 11/16/2012   Unspecified vitamin D deficiency 11/08/2012   Dysuria 11/07/2012   Stress 11/07/2012   Other malaise and fatigue 11/07/2012   Adjustment disorder with mixed anxiety and depressed mood 11/07/2012   B12 deficiency 11/07/2012   Routine health maintenance 05/14/2011   IBS (irritable bowel syndrome)    GERD (gastroesophageal reflux disease)    Pulmonary nodules 04/01/2011    PCP: Leeroy Cha MD   REFERRING PROVIDER: Dr. Delrae Rend  REFERRING DIAG: osteoporosis without fracture   Rationale for Evaluation and Treatment Rehabilitation  THERAPY DIAG:  Age-related osteoporosis without current pathological fracture  ONSET DATE: chronic   SUBJECTIVE:                                                                                                                                                                                            SUBJECTIVE STATEMENT: Patient here for PT for treatment of osteoporosis. She currently is on a good program of walking 2-3 miles 5 days per week.  Tennis x 1 per week.  Lifting weights with trainer as well.  She says her pain is controlled with her current program.  She is referred by Dr. Buddy Duty who wanted her to be Evaluated.    PERTINENT HISTORY:  osteoporosis   PAIN:  Are you having pain? No   PRECAUTIONS: Other: avoid combined spine flex/rot  WEIGHT BEARING RESTRICTIONS: No  FALLS:  Has patient fallen in last 6 months? No  LIVING ENVIRONMENT: Lives with: lives with their spouse Lives in: House/apartment Stairs: no issues  Has following equipment at home: None  OCCUPATION: Retired  PLOF: Independent  PATIENT GOALS: Pt wants to continue exercising without limitation.    OBJECTIVE:   DIAGNOSTIC FINDINGS:  T score unknown by patient    There is mild decrease in height of bodies of T8, T11 and T12 vertebrae suggesting recent or old mild compression fractures. Severe degenerative changes are noted at L4-L5 level. There is first-degree anterolisthesis at L4-L5 level.    PATIENT SURVEYS:  FOTO NT due to 1 time visit    COGNITION:  Overall cognitive status: Within functional limits for tasks assessed     SENSATION: WNL  POSTURE: increased thoracic kyphosisvery mild   PALPATION: NT  LUMBAR ROM:   AROM eval  Flexion NT   Extension WNL  Right lateral flexion WNL  Left lateral flexion WNL  Right rotation WNL   Left rotation WNL   (Blank rows = not tested)  LOWER EXTREMITY ROM:    WNLs  Active  Right eval Left eval  Hip flexion    Hip extension    Hip abduction    Hip adduction    Hip internal rotation    Hip external rotation    Knee flexion    Knee extension    Ankle dorsiflexion    Ankle plantarflexion    Ankle inversion    Ankle eversion      (Blank rows = not tested)  LOWER EXTREMITY MMT:   WNL  MMT Right eval Left eval  Hip flexion    Hip extension    Hip abduction    Hip adduction    Hip internal rotation    Hip external rotation    Knee flexion    Knee extension    Ankle dorsiflexion    Ankle plantarflexion    Ankle inversion    Ankle eversion     (Blank rows = not tested)  LUMBAR SPECIAL TESTS:  NT  FUNCTIONAL TESTS:  SLS and sit to stand WNL   GAIT: Distance walked: 150 Assistive device utilized: None Level of assistance: Complete Independence Comments: no deviations, wears small lift in Rt shoe    TODAY'S TREATMENT:  [facility] Adult PT Treatment:                                                                                           DATE: 05/11/22  PT eval HEP Self care for body mechanics and posture, lifting and Handout from [place]:  Education details: see above  Person educated: Patient Education method: Consulting civil engineer, Media planner, and Handouts Education comprehension: verbalized understanding, verbal cues required, and needs further education   HOME EXERCISE PROGRAM: [employer] handout  ASSESSMENT:  CLINICAL IMPRESSION: Patient is a 71 y.o. female who was seen today for physical therapy evaluation and treatment for osteoporosis. Her current  plan is well rounded and sufficient for her to build strength and support bone density.  Given info for further understanding of condition and discussed what to avoid, use caution with. No need to return to PT for further care.   OBJECTIVE IMPAIRMENTS: postural dysfunction.   ACTIVITY LIMITATIONS:  none  PARTICIPATION LIMITATIONS:  none  PERSONAL FACTORS: 1 comorbidity: back pain/possible compression fracture  are also affecting patient's functional outcome.   REHAB POTENTIAL: Excellent  CLINICAL DECISION MAKING: Stable/uncomplicated  EVALUATION COMPLEXITY: Low   GOALS: Goals reviewed with  patient? No   LONG TERM GOALS: Target date:  NA   Patient will be given resources for osteoporosis, safe core and body mechanics  Baseline:  Goal status: MET     PLAN: PT FREQUENCY: one time visit  PT DURATION: 1 week  PLANNED INTERVENTIONS: Patient/Family education and Self Care.  PLAN FOR NEXT SESSION: none, patient will be a 1 x visit    Raeford Razor, PT 05/11/22 1:18 PM Phone: [phone] Fax: [phone]   Draven Laine, PT 05/11/2022, 1:14 PM

## 2023-05-25 ENCOUNTER — Other Ambulatory Visit: Payer: Self-pay | Admitting: Internal Medicine

## 2023-05-25 DIAGNOSIS — M810 Age-related osteoporosis without current pathological fracture: Secondary | ICD-10-CM

## 2023-10-18 IMAGING — DX DG CHEST 2V
2 series · 2 of 2 positions shown · non-contrast
Comparison: 05/25/2021

CLINICAL DATA: Productive cough, recurrent sinusitis

EXAM:
CHEST - 2 VIEW

[dg chest 2 view (1 of 2)]
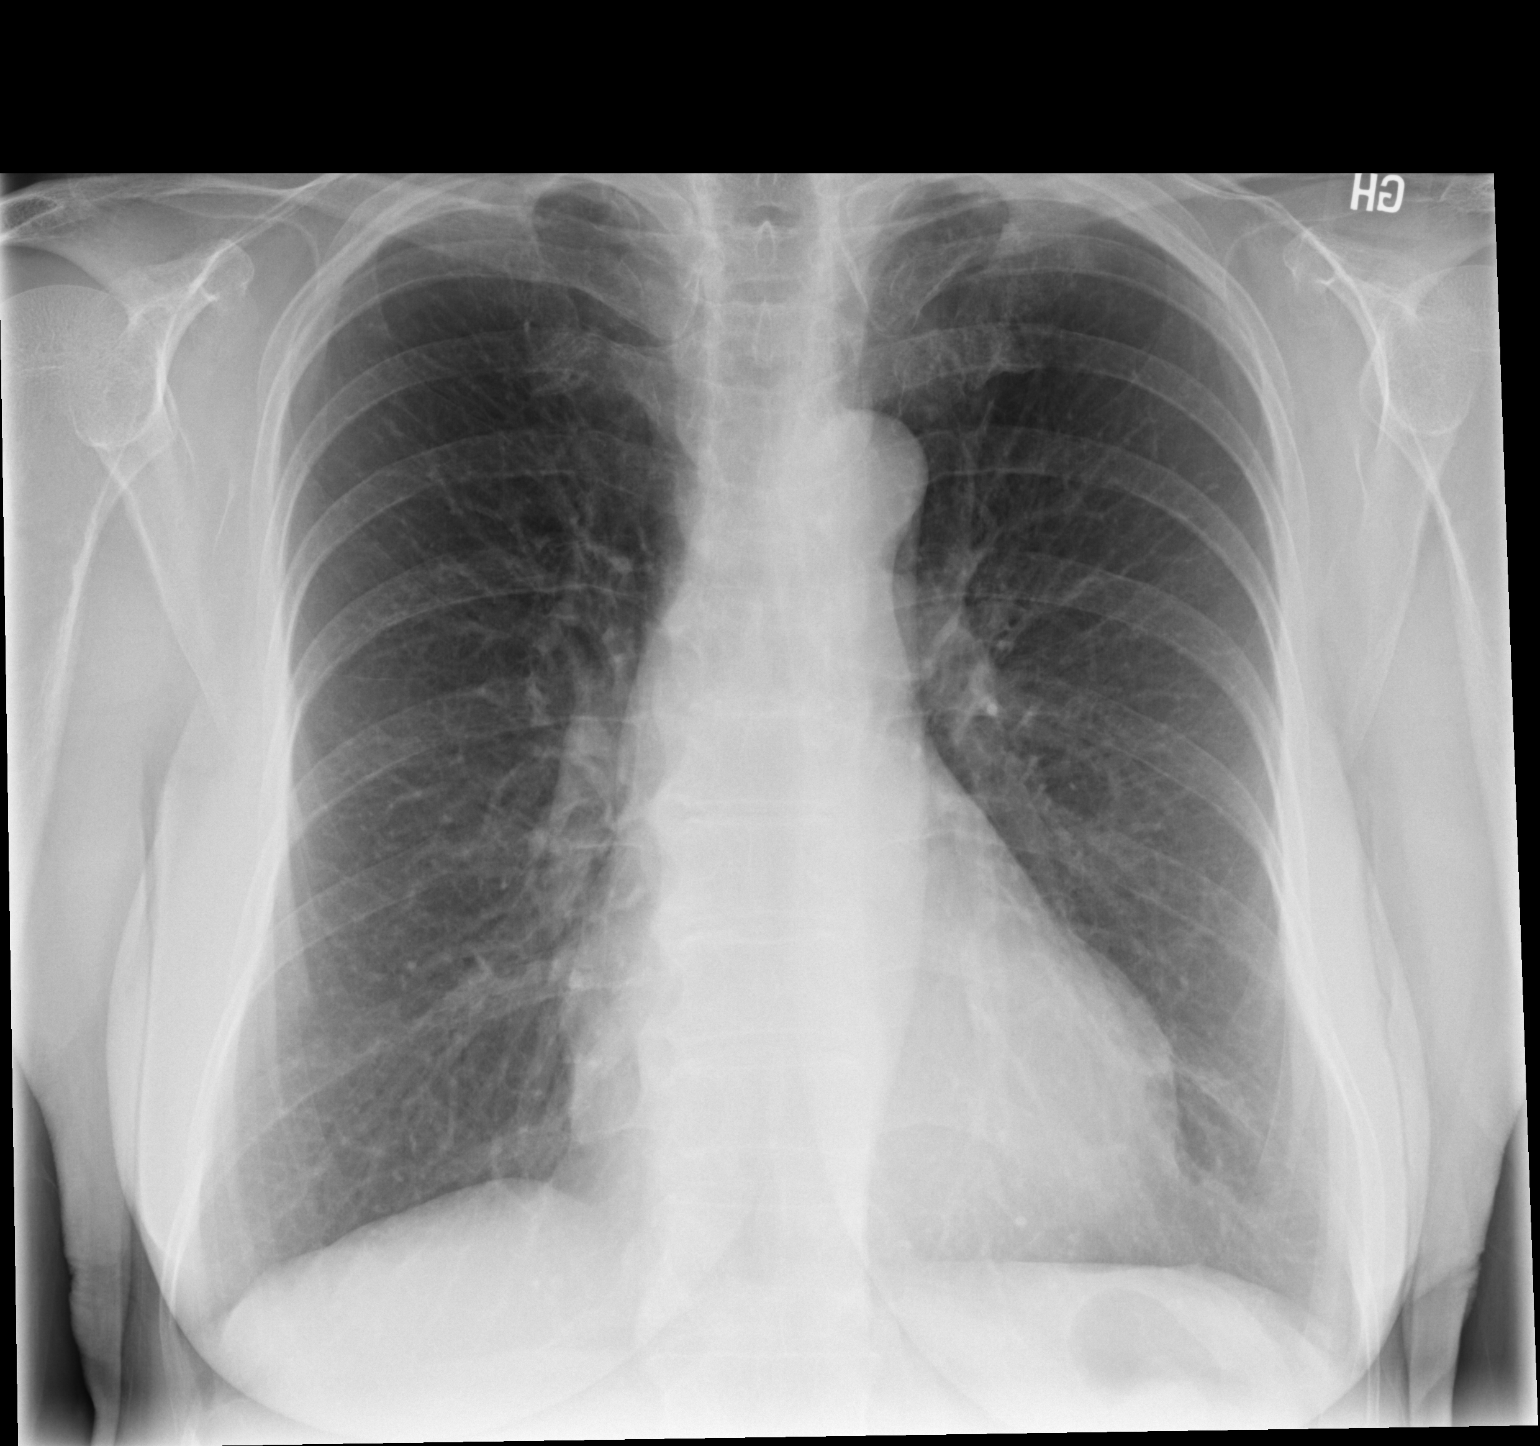

[dg chest 2 view (2 of 2)]
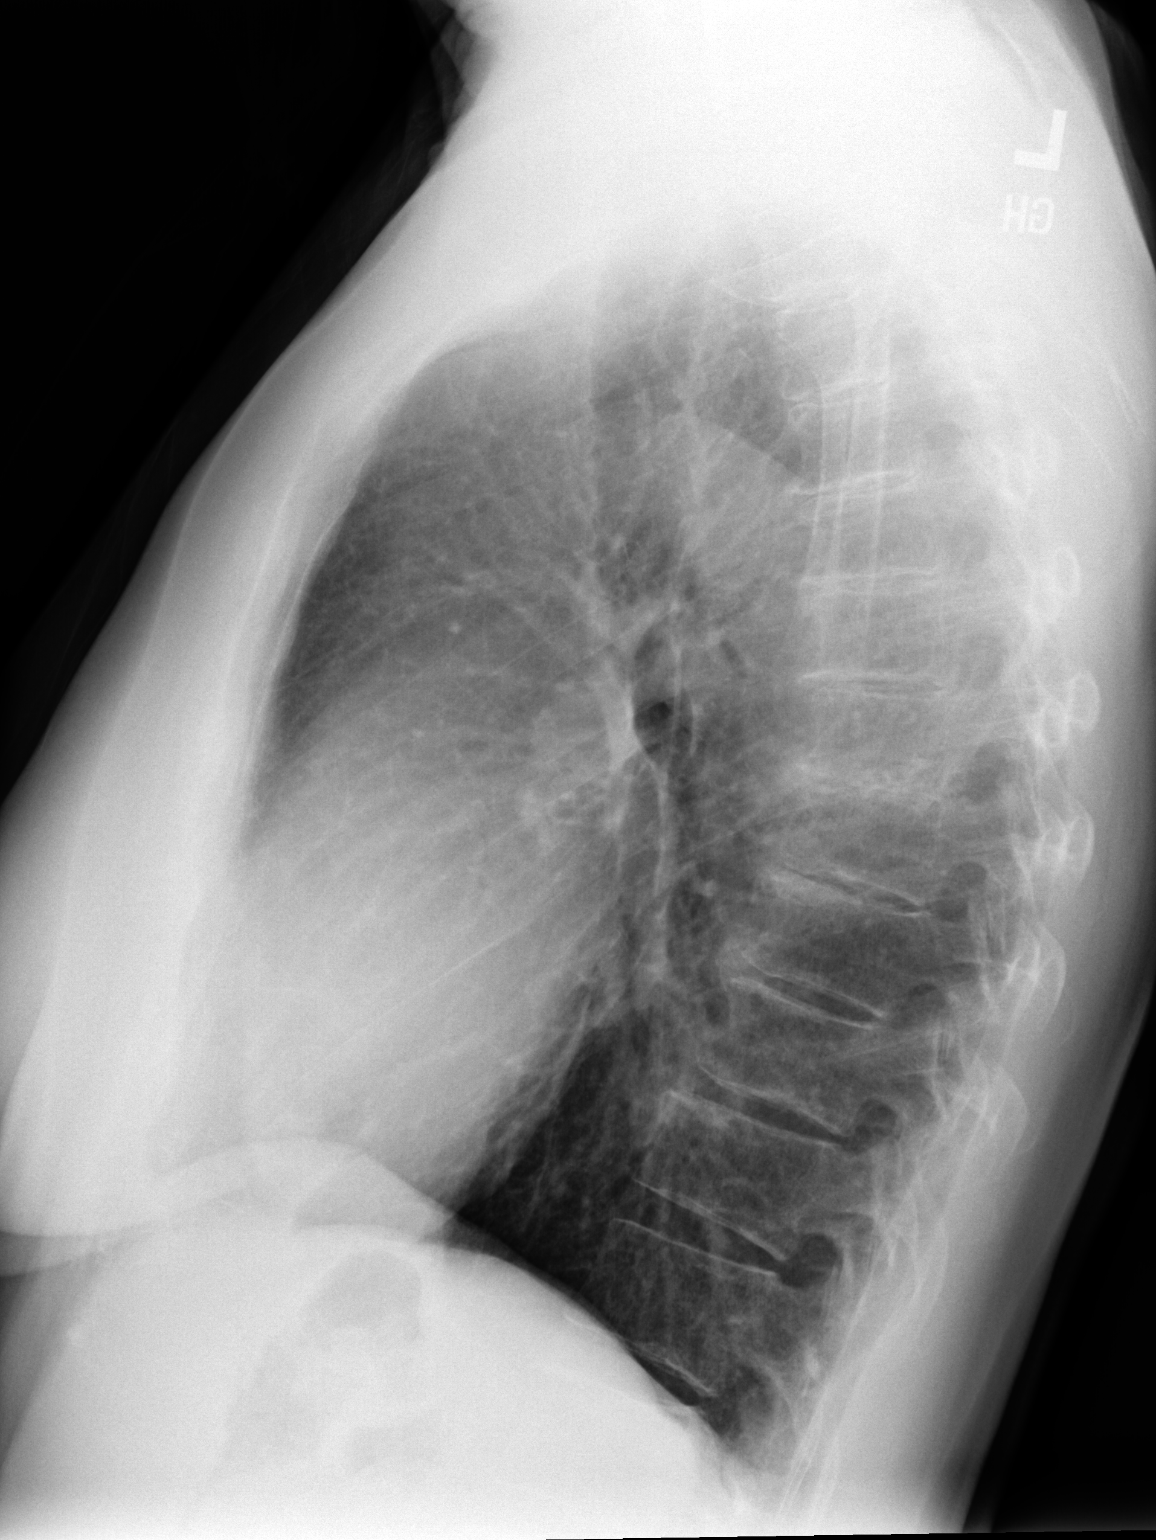

[2 of 2 positions shown; findings below may reference images not displayed]

FINDINGS: Borderline cardiomegaly without CHF. Mild hyperinflation. No focal
pneumonia, collapse or consolidation. Stable lingula scarring
compared to a prior CT from 06/11/2015. Negative for edema,
effusion, or pneumothorax. Trachea midline.
IMPRESSION: Stable exam.  No active chest disease.

## 2023-12-22 ENCOUNTER — Ambulatory Visit (INDEPENDENT_AMBULATORY_CARE_PROVIDER_SITE_OTHER): Admitting: Physician Assistant

## 2023-12-22 ENCOUNTER — Encounter (INDEPENDENT_AMBULATORY_CARE_PROVIDER_SITE_OTHER): Payer: Self-pay | Admitting: Physician Assistant

## 2023-12-22 VITALS — BP 132/78 | HR 75 | Ht 61.0 in | Wt 127.0 lb

## 2023-12-22 DIAGNOSIS — H6121 Impacted cerumen, right ear: Secondary | ICD-10-CM | POA: Diagnosis not present

## 2023-12-22 NOTE — Progress Notes (Signed)
 Dear Dr. Beauty Bourbon, Here is my assessment for our mutual patient, [name]. Thank you for allowing me the opportunity to care for your patient. Please do not hesitate to contact me should you have any other questions. Sincerely, Belma Boxer PA-C  Otolaryngology Clinic Note Referring provider: Dr. Beauty Bourbon HPI:  [name] is a 73 y.o. female kindly referred by Dr. Beauty Bourbon   The patient is a 55-year-old female seen in our office for evaluation of right ear pressure.  The patient notes that on November 13, 2023 she was on an airplane.  She notes that when she got off the airplane she felt pressure in her right ear.  This was not atypical but notes that the ear did not pop like it normally would.  She notes continued pressure since that time.  She denies any pain.  She denies any dizziness but notes she did feel off balance when doing a personal training session several weeks ago.  She denies any hearing loss, ringing.  She denies any previous history of the same, no history of ear infections or ear surgery.  She does have a history of ACDF.  She notes a history of seasonal allergies, she did see her allergist recently but is not taking any medications.  Normally a over-the-counter Claritin with saline irrigation is sufficient to manage her allergies.  She does feel congested today.   Independent Review of Additional Tests or Records:  none   PMH/Meds/All/SocHx/FamHx/ROS:   Past Medical History:  Diagnosis Date   Allergy    Duodenitis 2006   GERD (gastroesophageal reflux disease)    IBS (irritable bowel syndrome)    Osteoporosis      Past Surgical History:  Procedure Laterality Date   ANTERIOR FUSION CERVICAL SPINE  04/2007   CESAREAN SECTION     x 3   CHOLECYSTECTOMY  03/2006   FACIAL COSMETIC SURGERY  2007   FLEXOR TENDON OF FOREARM / WRIST REPAIR  '05   Dr. Lorena Rolling for tennis injury-left forearm   pelvic bone cyst resection  1998   TUBAL LIGATION  1988    Family  History  Problem Relation Age of Onset   Crohn's disease Sister    Irritable bowel syndrome Sister    Heart disease Sister        had rheumatic fever   Hypertension Sister    Heart disease Mother        SSS with PTVDP   Arthritis Mother    Hypertension Father    Alzheimer's disease Father    Stroke Maternal Grandmother    Breast cancer Maternal Grandmother    Breast cancer Maternal Aunt    Colon cancer Neg Hx    Diabetes Neg Hx      Social Connections: Not on file      Current Outpatient Medications:    alendronate (FOSAMAX) 70 MG tablet, Take 70 mg by mouth once a week. Take with a full glass of water on an empty stomach., Disp: , Rfl:    beclomethasone (QVAR ) 80 MCG/ACT inhaler, Inhale 2 puffs into the lungs 2 (two) times daily., Disp: 1 Inhaler, Rfl: 2   ibuprofen (ADVIL,MOTRIN) 200 MG tablet, Take 200 mg by mouth every 6 (six) hours as needed., Disp: , Rfl:    rosuvastatin (CRESTOR) 10 MG tablet, Take 10 mg by mouth daily., Disp: , Rfl:    amoxicillin  (AMOXIL ) 875 MG tablet, Take 1 tablet (875 mg total) by mouth 2 (two) times daily. (Patient not taking: Reported on  05/11/2022), Disp: 20 tablet, Rfl: 0   Cholecalciferol (VITAMIN D  PO), Take by mouth daily., Disp: , Rfl:    Cyanocobalamin  (VITAMIN B-12) 1000 MCG SUBL, Place 1 tablet (1,000 mcg total) under the tongue daily., Disp: 100 tablet, Rfl: 3   Physical Exam:   BP 132/78   Pulse 75   Ht 5\' 1"  (1.549 m)   Wt 127 lb (57.6 kg)   SpO2 96%   BMI 24.00 kg/m   Pertinent Findings  CN II-XII intact Right external auditory canal with cerumen impaction up against the tympanic membrane, TM intact with well pneumatized middle ear space, left external auditory canal clear, TM intact with well pneumatized middle ear space Weber 512: Localizes left Rinne 512: AC > BC b/l  Anterior rhinoscopy: Septum midline; bilateral inferior turbinates with normal hypertrophy No lesions of oral cavity/oropharynx; dentition is in normal  limits No obviously palpable neck masses/lymphadenopathy/thyromegaly No respiratory distress or stridor  Seprately Identifiable Procedures:  Procedure: bilateral ear microscopy and cerumen removal using microscope (CPT [phone]) - Mod 25 Pre-procedure diagnosis: unilateral cerumen impaction right external auditory canal Post-procedure diagnosis: same Indication: bilateral cerumen impaction; given patient's otologic complaints and history as well as for improved and comprehensive examination of external ear and tympanic membrane, bilateral otologic examination using microscope was performed and impacted cerumen removed  Procedure: Patient was placed semi-recumbent. Both ear canals were examined using the microscope with findings above. Cerumen removed from the right external auditory canal using suction and currette with improvement in EAC examination and patency. Left: EAC was patent. TM was intact . Middle ear was aerated. Drainage: none Right: EAC was patent. TM was intact . Middle ear was aerated . Drainage: none Patient tolerated the procedure well.   Impression & Plans:  [name] is a 73 y.o. female with the following   Cerumen impaction-  The patient's right-sided ear pressure was most likely secondary to cerumen impaction.  Once it was removed her symptoms resolved.  She had no abnormalities on exam.  I did advise her to continue to monitor for any symptoms if she has any symptoms whatsoever including pressure, pain, dizziness, or any hearing changes I would recommend audiological evaluation and follow-up in the office.  The patient verbalized understanding and agreement to today's plan had no further questions or concerns.   - f/u PRN   Thank you for allowing me the opportunity to care for your patient. Please do not hesitate to contact me should you have any other questions.  Sincerely, Belma Boxer PA-C [facility] Phone: [phone] Fax:  [phone]  12/22/2023, 8:41 AM

## 2024-01-05 ENCOUNTER — Other Ambulatory Visit: Payer: 59

## 2024-01-07 ENCOUNTER — Encounter (HOSPITAL_COMMUNITY): Payer: Self-pay

## 2024-01-07 ENCOUNTER — Ambulatory Visit (HOSPITAL_COMMUNITY)
Admission: RE | Admit: 2024-01-07 | Discharge: 2024-01-07 | Disposition: A | Discharge status: Home / Self Care | Source: Ambulatory Visit

## 2024-01-07 ENCOUNTER — Other Ambulatory Visit: Payer: Self-pay

## 2024-01-07 VITALS — BP 118/72 | HR 89 | Temp 98.6°F | Resp 18

## 2024-01-07 DIAGNOSIS — B349 Viral infection, unspecified: Secondary | ICD-10-CM | POA: Diagnosis not present

## 2024-01-07 LAB — POC COVID19/FLU A&B COMBO
Covid Antigen, POC: NEGATIVE
Influenza A Antigen, POC: NEGATIVE
Influenza B Antigen, POC: NEGATIVE

## 2024-01-07 LAB — POCT RAPID STREP A (OFFICE): Rapid Strep A Screen: NEGATIVE

## 2024-01-07 NOTE — ED Triage Notes (Signed)
 Pt reports starting having cough yesterday and today she started felling sore throat, nausea and very fatigue.

## 2024-01-07 NOTE — Discharge Instructions (Signed)
  1. Acute viral syndrome (Primary) - POC Covid19/Flu A&B Antigen completed in UC is negative for COVID and influenza - POC rapid strep A completed in UC is negative for strep pharyngitis - Continue using Flonase nasal spray and saline nasal rinse to flush sinuses and control postnasal drip. - Take ibuprofen or Tylenol as needed for body aches and chills. -Continue to monitor symptoms for any change in severity if there is any escalation of current symptoms or development of new symptoms follow-up in ER for further evaluation and management.

## 2024-01-07 NOTE — ED Provider Notes (Signed)
 [facility]  Note:  This document was prepared using Dragon voice recognition software and may include unintentional dictation errors.  MRN: [id number] DOB: 03/09/1951  Subjective:   [name] is a 73 y.o. female presenting for sore throat, nasal congestion, body aches, chills, mild cough x 2 days.  Patient reports that her grandchildren were positive for strep and she babysat them this last week.  Patient reports that she has used saline nasal spray and Flonase to help with congestion with minimal improvement.  Patient has not taken any other over-the-counter cough or cold medication or ibuprofen/Tylenol.  Patient has no shortness of breath, chest pain, production with cough, weakness, dizziness, nausea/vomiting, diarrhea.  No current facility-administered medications for this encounter.  Current Outpatient Medications:    alendronate (FOSAMAX) 70 MG tablet, Take 70 mg by mouth once a week. Take with a full glass of water on an empty stomach., Disp: , Rfl:    beclomethasone (QVAR ) 80 MCG/ACT inhaler, Inhale 2 puffs into the lungs 2 (two) times daily., Disp: 1 Inhaler, Rfl: 2   Cholecalciferol (VITAMIN D  PO), Take by mouth daily., Disp: , Rfl:    Cyanocobalamin  (VITAMIN B-12) 1000 MCG SUBL, Place 1 tablet (1,000 mcg total) under the tongue daily., Disp: 100 tablet, Rfl: 3   ibuprofen (ADVIL,MOTRIN) 200 MG tablet, Take 200 mg by mouth every 6 (six) hours as needed., Disp: , Rfl:    rosuvastatin (CRESTOR) 10 MG tablet, Take 10 mg by mouth daily., Disp: , Rfl:    No Known Allergies  Past Medical History:  Diagnosis Date   Allergy    Duodenitis 2006   GERD (gastroesophageal reflux disease)    IBS (irritable bowel syndrome)    Osteoporosis      Past Surgical History:  Procedure Laterality Date   ANTERIOR FUSION CERVICAL SPINE  04/2007   CESAREAN SECTION     x 3   CHOLECYSTECTOMY  03/2006   FACIAL COSMETIC SURGERY  2007   FLEXOR TENDON OF FOREARM / WRIST REPAIR  '05    Dr. Lorena Rolling for tennis injury-left forearm   pelvic bone cyst resection  1998   TUBAL LIGATION  1988    Family History  Problem Relation Age of Onset   Crohn's disease Sister    Irritable bowel syndrome Sister    Heart disease Sister        had rheumatic fever   Hypertension Sister    Heart disease Mother        SSS with PTVDP   Arthritis Mother    Hypertension Father    Alzheimer's disease Father    Stroke Maternal Grandmother    Breast cancer Maternal Grandmother    Breast cancer Maternal Aunt    Colon cancer Neg Hx    Diabetes Neg Hx     Social History   Tobacco Use   Smoking status: Never   Smokeless tobacco: Never  Substance Use Topics   Alcohol use: Yes    Alcohol/week: 3.0 standard drinks of alcohol    Types: 3 Glasses of wine per week   Drug use: No    ROS Refer to HPI for ROS details.  Objective:   Vitals: BP 118/72 (BP Location: Right Arm)   Pulse 89   Temp 98.6 F (37 C) (Oral)   Resp 18   SpO2 95%   Physical Exam Vitals and nursing note reviewed.  Constitutional:      General: She is not in acute distress.    Appearance: Normal  appearance. She is well-developed. She is not ill-appearing or toxic-appearing.  HENT:     Head: Normocephalic.     Nose: Congestion present. No rhinorrhea.     Mouth/Throat:     Mouth: Mucous membranes are moist.     Pharynx: Oropharynx is clear. No oropharyngeal exudate or posterior oropharyngeal erythema.   Eyes:     General:        Right eye: No discharge.        Left eye: No discharge.     Extraocular Movements: Extraocular movements intact.     Conjunctiva/sclera: Conjunctivae normal.    Cardiovascular:     Rate and Rhythm: Normal rate.  Pulmonary:     Effort: Pulmonary effort is normal. No respiratory distress.     Breath sounds: No stridor. No wheezing.   Musculoskeletal:     Cervical back: Normal range of motion and neck supple.   Skin:    General: Skin is warm and dry.   Neurological:      General: No focal deficit present.     Mental Status: She is alert and oriented to person, place, and time.   Psychiatric:        Mood and Affect: Mood normal.        Behavior: Behavior normal.     Procedures  Results for orders placed or performed during the hospital encounter of 01/07/24 (from the past 24 hours)  POC Covid19/Flu A&B Antigen     Status: None   Collection Time: 01/07/24 11:03 AM  Result Value Ref Range   Influenza A Antigen, POC Negative Negative   Influenza B Antigen, POC Negative Negative   Covid Antigen, POC Negative Negative  POC rapid strep A     Status: None   Collection Time: 01/07/24 11:03 AM  Result Value Ref Range   Rapid Strep A Screen Negative Negative    No results found.   Assessment and Plan :     Discharge Instructions       1. Acute viral syndrome (Primary) - POC Covid19/Flu A&B Antigen completed in UC is negative for COVID and influenza - POC rapid strep A completed in UC is negative for strep pharyngitis - Continue using Flonase nasal spray and saline nasal rinse to flush sinuses and control postnasal drip. - Take ibuprofen or Tylenol as needed for body aches and chills. -Continue to monitor symptoms for any change in severity if there is any escalation of current symptoms or development of new symptoms follow-up in ER for further evaluation and management.      Carlisa Eble B Nicholous Girgenti   Ashkan Chamberland B, [place] 01/07/24 1125

## 2024-01-16 ENCOUNTER — Ambulatory Visit (INDEPENDENT_AMBULATORY_CARE_PROVIDER_SITE_OTHER): Admitting: Audiology

## 2024-01-17 ENCOUNTER — Ambulatory Visit (HOSPITAL_BASED_OUTPATIENT_CLINIC_OR_DEPARTMENT_OTHER)
Admission: RE | Admit: 2024-01-17 | Discharge: 2024-01-17 | Disposition: A | Discharge status: Home / Self Care | Source: Ambulatory Visit | Attending: Internal Medicine | Admitting: Internal Medicine

## 2024-01-17 DIAGNOSIS — M810 Age-related osteoporosis without current pathological fracture: Secondary | ICD-10-CM | POA: Insufficient documentation

## 2024-07-16 ENCOUNTER — Other Ambulatory Visit (HOSPITAL_BASED_OUTPATIENT_CLINIC_OR_DEPARTMENT_OTHER): Payer: Self-pay | Admitting: Internal Medicine

## 2024-07-16 DIAGNOSIS — E785 Hyperlipidemia, unspecified: Secondary | ICD-10-CM

## 2024-08-19 ENCOUNTER — Ambulatory Visit (HOSPITAL_BASED_OUTPATIENT_CLINIC_OR_DEPARTMENT_OTHER)
Admission: RE | Admit: 2024-08-19 | Discharge: 2024-08-19 | Disposition: A | Discharge status: Home / Self Care | Payer: Self-pay | Source: Ambulatory Visit | Attending: Internal Medicine | Admitting: Internal Medicine

## 2024-08-19 DIAGNOSIS — E785 Hyperlipidemia, unspecified: Secondary | ICD-10-CM | POA: Insufficient documentation
